# Patient Record
Sex: Female | Born: 1975
Health system: Southern US, Community
[De-identification: ages and names within clinical notes are randomized; demographics above are authoritative.]

## PROBLEM LIST (undated history)

## (undated) DIAGNOSIS — I509 Heart failure, unspecified: Secondary | ICD-10-CM

## (undated) DIAGNOSIS — R569 Unspecified convulsions: Secondary | ICD-10-CM

## (undated) DIAGNOSIS — F419 Anxiety disorder, unspecified: Secondary | ICD-10-CM

## (undated) DIAGNOSIS — C801 Malignant (primary) neoplasm, unspecified: Secondary | ICD-10-CM

## (undated) HISTORY — PX: OTHER SURGICAL HISTORY: SHX169

## (undated) HISTORY — PX: TUBAL LIGATION: SHX77

---

## 2004-05-16 ENCOUNTER — Emergency Department (HOSPITAL_COMMUNITY): Admission: EM | Admit: 2004-05-16 | Discharge: 2004-05-16 | Payer: Self-pay | Admitting: Emergency Medicine

## 2004-06-14 ENCOUNTER — Emergency Department (HOSPITAL_COMMUNITY): Admission: EM | Admit: 2004-06-14 | Discharge: 2004-06-14 | Payer: Self-pay | Admitting: Emergency Medicine

## 2004-07-12 ENCOUNTER — Emergency Department (HOSPITAL_COMMUNITY): Admission: EM | Admit: 2004-07-12 | Discharge: 2004-07-12 | Payer: Self-pay | Admitting: Emergency Medicine

## 2005-02-03 ENCOUNTER — Emergency Department (HOSPITAL_COMMUNITY): Admission: EM | Admit: 2005-02-03 | Discharge: 2005-02-03 | Payer: Self-pay | Admitting: Emergency Medicine

## 2005-07-03 ENCOUNTER — Other Ambulatory Visit: Payer: Self-pay

## 2005-07-03 ENCOUNTER — Emergency Department: Payer: Self-pay | Admitting: Emergency Medicine

## 2013-10-21 ENCOUNTER — Emergency Department: Payer: Self-pay | Admitting: Emergency Medicine

## 2013-11-04 ENCOUNTER — Emergency Department: Payer: Self-pay | Admitting: Emergency Medicine

## 2013-12-31 ENCOUNTER — Emergency Department: Payer: Self-pay | Admitting: Emergency Medicine

## 2013-12-31 LAB — COMPREHENSIVE METABOLIC PANEL
ALT: 20 U/L
AST: 27 U/L (ref 15–37)
Albumin: 3.5 g/dL (ref 3.4–5.0)
Alkaline Phosphatase: 91 U/L
Anion Gap: 7 (ref 7–16)
BUN: 14 mg/dL (ref 7–18)
Bilirubin,Total: 0.3 mg/dL (ref 0.2–1.0)
Calcium, Total: 8.5 mg/dL (ref 8.5–10.1)
Chloride: 107 mmol/L (ref 98–107)
Co2: 27 mmol/L (ref 21–32)
Creatinine: 0.76 mg/dL (ref 0.60–1.30)
Glucose: 106 mg/dL — ABNORMAL HIGH (ref 65–99)
Osmolality: 282 (ref 275–301)
Potassium: 3.8 mmol/L (ref 3.5–5.1)
Sodium: 141 mmol/L (ref 136–145)
TOTAL PROTEIN: 7.7 g/dL (ref 6.4–8.2)

## 2013-12-31 LAB — CBC
HCT: 36.8 % (ref 35.0–47.0)
HGB: 11.4 g/dL — ABNORMAL LOW (ref 12.0–16.0)
MCH: 24.8 pg — ABNORMAL LOW (ref 26.0–34.0)
MCHC: 30.9 g/dL — ABNORMAL LOW (ref 32.0–36.0)
MCV: 80 fL (ref 80–100)
Platelet: 149 10*3/uL — ABNORMAL LOW (ref 150–440)
RBC: 4.58 10*6/uL (ref 3.80–5.20)
RDW: 17.8 % — ABNORMAL HIGH (ref 11.5–14.5)
WBC: 12.6 10*3/uL — AB (ref 3.6–11.0)

## 2013-12-31 LAB — ETHANOL

## 2013-12-31 LAB — TROPONIN I

## 2014-01-28 ENCOUNTER — Emergency Department: Payer: Self-pay | Admitting: Student

## 2014-01-28 LAB — COMPREHENSIVE METABOLIC PANEL
ALBUMIN: 3.3 g/dL — AB (ref 3.4–5.0)
ALT: 22 U/L
Alkaline Phosphatase: 85 U/L
Anion Gap: 7 (ref 7–16)
BUN: 12 mg/dL (ref 7–18)
Bilirubin,Total: 0.1 mg/dL — ABNORMAL LOW (ref 0.2–1.0)
CHLORIDE: 110 mmol/L — AB (ref 98–107)
CO2: 25 mmol/L (ref 21–32)
CREATININE: 0.66 mg/dL (ref 0.60–1.30)
Calcium, Total: 8.4 mg/dL — ABNORMAL LOW (ref 8.5–10.1)
EGFR (African American): >60
EGFR (Non-African Amer.): >60
Glucose: 121 mg/dL — ABNORMAL HIGH (ref 65–99)
OSMOLALITY: 284 (ref 275–301)
POTASSIUM: 3.2 mmol/L — AB (ref 3.5–5.1)
SGOT(AST): 20 U/L (ref 15–37)
Sodium: 142 mmol/L (ref 136–145)
TOTAL PROTEIN: 7.2 g/dL (ref 6.4–8.2)

## 2014-01-28 LAB — URINALYSIS, COMPLETE
Bilirubin,UR: NEGATIVE
Blood: NEGATIVE
Glucose,UR: NEGATIVE mg/dL (ref 0–75)
Ketone: NEGATIVE
Nitrite: NEGATIVE
PH: 5 (ref 4.5–8.0)
Protein: NEGATIVE
RBC,UR: 2 /HPF (ref 0–5)
SPECIFIC GRAVITY: 1.027 (ref 1.003–1.030)
Squamous Epithelial: 27
WBC UR: 4 /HPF (ref 0–5)

## 2014-01-28 LAB — CBC
HCT: 34.9 % — ABNORMAL LOW (ref 35.0–47.0)
HGB: 10.8 g/dL — AB (ref 12.0–16.0)
MCH: 25.4 pg — AB (ref 26.0–34.0)
MCHC: 31 g/dL — ABNORMAL LOW (ref 32.0–36.0)
MCV: 82 fL (ref 80–100)
Platelet: 136 10*3/uL — ABNORMAL LOW (ref 150–440)
RBC: 4.25 10*6/uL (ref 3.80–5.20)
RDW: 15.6 % — ABNORMAL HIGH (ref 11.5–14.5)
WBC: 13.1 10*3/uL — ABNORMAL HIGH (ref 3.6–11.0)

## 2014-01-28 LAB — PREGNANCY, URINE: PREGNANCY TEST, URINE: NEGATIVE m[IU]/mL

## 2014-02-21 ENCOUNTER — Emergency Department: Payer: Self-pay | Admitting: Emergency Medicine

## 2014-02-21 LAB — BASIC METABOLIC PANEL
ANION GAP: 7 (ref 7–16)
BUN: 11 mg/dL (ref 7–18)
CO2: 25 mmol/L (ref 21–32)
Calcium, Total: 9.1 mg/dL (ref 8.5–10.1)
Chloride: 108 mmol/L — ABNORMAL HIGH (ref 98–107)
Creatinine: 0.92 mg/dL (ref 0.60–1.30)
EGFR (Non-African Amer.): >60
GLUCOSE: 98 mg/dL (ref 65–99)
Osmolality: 279 (ref 275–301)
Potassium: 3.8 mmol/L (ref 3.5–5.1)
Sodium: 140 mmol/L (ref 136–145)

## 2014-02-21 LAB — CBC
HCT: 36.6 % (ref 35.0–47.0)
HGB: 11.4 g/dL — ABNORMAL LOW (ref 12.0–16.0)
MCH: 25.5 pg — ABNORMAL LOW (ref 26.0–34.0)
MCHC: 31.1 g/dL — AB (ref 32.0–36.0)
MCV: 82 fL (ref 80–100)
Platelet: 171 10*3/uL (ref 150–440)
RBC: 4.48 10*6/uL (ref 3.80–5.20)
RDW: 16.2 % — AB (ref 11.5–14.5)
WBC: 12.5 10*3/uL — ABNORMAL HIGH (ref 3.6–11.0)

## 2014-03-07 ENCOUNTER — Emergency Department: Payer: Self-pay | Admitting: Emergency Medicine

## 2014-03-08 LAB — URINALYSIS, COMPLETE
BILIRUBIN, UR: NEGATIVE
BLOOD: NEGATIVE
Bacteria: NONE SEEN
Glucose,UR: NEGATIVE mg/dL (ref 0–75)
Ketone: NEGATIVE
LEUKOCYTE ESTERASE: NEGATIVE
Nitrite: NEGATIVE
Ph: 6 (ref 4.5–8.0)
Protein: NEGATIVE
RBC,UR: 1 /HPF (ref 0–5)
Specific Gravity: 1.028 (ref 1.003–1.030)
Squamous Epithelial: 1
WBC UR: 1 /HPF (ref 0–5)

## 2014-03-08 LAB — BASIC METABOLIC PANEL
Anion Gap: 5 — ABNORMAL LOW (ref 7–16)
BUN: 16 mg/dL (ref 7–18)
CHLORIDE: 113 mmol/L — AB (ref 98–107)
CO2: 26 mmol/L (ref 21–32)
Calcium, Total: 8.5 mg/dL (ref 8.5–10.1)
Creatinine: 1.07 mg/dL (ref 0.60–1.30)
EGFR (African American): >60
GLUCOSE: 100 mg/dL — AB (ref 65–99)
Osmolality: 288 (ref 275–301)
Potassium: 3.5 mmol/L (ref 3.5–5.1)
SODIUM: 144 mmol/L (ref 136–145)

## 2014-03-08 LAB — CBC
HCT: 33.6 % — AB (ref 35.0–47.0)
HGB: 10.5 g/dL — ABNORMAL LOW (ref 12.0–16.0)
MCH: 25.7 pg — ABNORMAL LOW (ref 26.0–34.0)
MCHC: 31.4 g/dL — ABNORMAL LOW (ref 32.0–36.0)
MCV: 82 fL (ref 80–100)
Platelet: 155 10*3/uL (ref 150–440)
RBC: 4.1 10*6/uL (ref 3.80–5.20)
RDW: 16.1 % — ABNORMAL HIGH (ref 11.5–14.5)
WBC: 11.8 10*3/uL — AB (ref 3.6–11.0)

## 2014-03-08 LAB — PREGNANCY, URINE: PREGNANCY TEST, URINE: NEGATIVE m[IU]/mL

## 2014-12-05 ENCOUNTER — Emergency Department
Admission: EM | Admit: 2014-12-05 | Discharge: 2014-12-05 | Disposition: A | Discharge status: Home / Self Care | Payer: Medicaid Other | Attending: Emergency Medicine | Admitting: Emergency Medicine

## 2014-12-05 ENCOUNTER — Encounter: Payer: Self-pay | Admitting: Emergency Medicine

## 2014-12-05 ENCOUNTER — Emergency Department: Payer: Medicaid Other

## 2014-12-05 DIAGNOSIS — R569 Unspecified convulsions: Secondary | ICD-10-CM | POA: Diagnosis present

## 2014-12-05 DIAGNOSIS — Z880 Allergy status to penicillin: Secondary | ICD-10-CM | POA: Insufficient documentation

## 2014-12-05 DIAGNOSIS — G40909 Epilepsy, unspecified, not intractable, without status epilepticus: Secondary | ICD-10-CM | POA: Insufficient documentation

## 2014-12-05 HISTORY — DX: Unspecified convulsions: R56.9

## 2014-12-05 HISTORY — DX: Heart failure, unspecified: I50.9

## 2014-12-05 LAB — CBC WITH DIFFERENTIAL/PLATELET
BASOS ABS: 0.1 10*3/uL (ref 0–0.1)
Basophils Relative: 1 %
Eosinophils Absolute: 0.1 10*3/uL (ref 0–0.7)
Eosinophils Relative: 1 %
HEMATOCRIT: 34.2 % — AB (ref 35.0–47.0)
HEMOGLOBIN: 11 g/dL — AB (ref 12.0–16.0)
Lymphocytes Relative: 17 %
Lymphs Abs: 1.8 10*3/uL (ref 1.0–3.6)
MCH: 24.8 pg — ABNORMAL LOW (ref 26.0–34.0)
MCHC: 32 g/dL (ref 32.0–36.0)
MCV: 77.4 fL — ABNORMAL LOW (ref 80.0–100.0)
Monocytes Absolute: 0.8 10*3/uL (ref 0.2–0.9)
Monocytes Relative: 8 %
NEUTROS ABS: 7.9 10*3/uL — AB (ref 1.4–6.5)
NEUTROS PCT: 73 %
Platelets: 154 10*3/uL (ref 150–440)
RBC: 4.42 MIL/uL (ref 3.80–5.20)
RDW: 16.8 % — ABNORMAL HIGH (ref 11.5–14.5)
WBC: 10.7 10*3/uL (ref 3.6–11.0)

## 2014-12-05 LAB — COMPREHENSIVE METABOLIC PANEL
ALK PHOS: 83 U/L (ref 38–126)
ALT: 13 U/L — ABNORMAL LOW (ref 14–54)
ANION GAP: 7 (ref 5–15)
AST: 26 U/L (ref 15–41)
Albumin: 4.5 g/dL (ref 3.5–5.0)
BILIRUBIN TOTAL: 0.5 mg/dL (ref 0.3–1.2)
BUN: 10 mg/dL (ref 6–20)
CALCIUM: 9.3 mg/dL (ref 8.9–10.3)
CO2: 24 mmol/L (ref 22–32)
Chloride: 108 mmol/L (ref 101–111)
Creatinine, Ser: 0.69 mg/dL (ref 0.44–1.00)
GFR calc non Af Amer: >60 mL/min (ref >60)
Glucose, Bld: 104 mg/dL — ABNORMAL HIGH (ref 65–99)
Potassium: 3.7 mmol/L (ref 3.5–5.1)
SODIUM: 139 mmol/L (ref 135–145)
TOTAL PROTEIN: 8.1 g/dL (ref 6.5–8.1)

## 2014-12-05 MED ORDER — SODIUM CHLORIDE 0.9 % IV SOLN
1000.0000 mg | Freq: Once | INTRAVENOUS | Status: AC
  Administered 2014-12-05: 1000 mg via INTRAVENOUS
  Filled 2014-12-05 (×2): qty 10

## 2014-12-05 MED ORDER — LORAZEPAM 2 MG/ML IJ SOLN
INTRAMUSCULAR | Status: AC
  Filled 2014-12-05: qty 1

## 2014-12-05 MED ORDER — LORAZEPAM 2 MG/ML IJ SOLN
2.0000 mg | Freq: Once | INTRAMUSCULAR | Status: AC
  Administered 2014-12-05: 2 mg via INTRAVENOUS

## 2014-12-05 NOTE — ED Provider Notes (Signed)
[facility] Emergency Department Jill Levy Note  ____________________________________________  Time seen: 1:30 AM  I have reviewed the triage vital signs and the nursing notes.   HISTORY  Chief Complaint Seizures      HPI Jill Levy is a 39 y.o. female resents with 2 witnessed generalized tonic seizure today by her son lasted approximately 1 minute each separated by approximately 3-4 minutes. Patient has a history of seizure disorder for which she takes Keppra 500 mg twice a day. Patient denies missing any doses except her evening dose tonight. Patient denies any recent illness no nausea no vomiting or fever. Of note patient states she has a history of a brain tumor for which she is followed by [facility] with plans for upcoming surgery.    Past medical history "Brain tumor" Epilepsy There are no active problems to display for this patient.   Past surgical history   Allergies Penicillin Lovenox No family history on file.  Social History Social History  Substance Use Topics  . Smoking status: Not on file  . Smokeless tobacco: Not on file  . Alcohol Use: Not on file    Review of Systems  Constitutional: Negative for fever. Eyes: Negative for visual changes. ENT: Negative for sore throat. Cardiovascular: Negative for chest pain. Respiratory: Negative for shortness of breath. Gastrointestinal: Negative for abdominal pain, vomiting and diarrhea. Genitourinary: Negative for dysuria. Musculoskeletal: Negative for back pain. Skin: Negative for rash. Neurological: Negative for headaches, focal weakness or numbness. Positive for seizure   10-point ROS otherwise negative.  ____________________________________________   PHYSICAL EXAM:  VITAL SIGNS: ED Triage Vitals  Enc Vitals Group     BP 12/05/14 0128 134/97 mmHg     Pulse Rate 12/05/14 0128 99     Resp 12/05/14 0128 16     Temp 12/05/14 0128 97.9 F (36.6 C)     Temp Source 12/05/14  0128 Oral     SpO2 12/05/14 0128 99 %     Weight 12/05/14 0128 161 lb (73.029 kg)     Height 12/05/14 0128 5\' 4"  (1.626 m)     Head Cir --      Peak Flow --      Pain Score 12/05/14 0133 8     Pain Loc --      Pain Edu? --      Excl. in [place]? --      Constitutional: Alert and oriented. Well appearing and in no distress. Eyes: Conjunctivae are normal. PERRL. Normal extraocular movements. ENT   Head: Normocephalic and atraumatic.   Nose: No congestion/rhinnorhea.   Mouth/Throat: Mucous membranes are moist.   Neck: No stridor. Cardiovascular: Normal rate, regular rhythm. Normal and symmetric distal pulses are present in all extremities. No murmurs, rubs, or gallops. Respiratory: Normal respiratory effort without tachypnea nor retractions. Breath sounds are clear and equal bilaterally. No wheezes/rales/rhonchi. Gastrointestinal: Soft and nontender. No distention. There is no CVA tenderness. Genitourinary: deferred Musculoskeletal: Nontender with normal range of motion in all extremities. No joint effusions.  No lower extremity tenderness nor edema. Neurologic:  Normal speech and language. No gross focal neurologic deficits are appreciated. Speech is normal.  Skin:  Skin is warm, dry and intact. No rash noted. Psychiatric: Mood and affect are normal. Speech and behavior are normal. Patient exhibits appropriate insight and judgment.  ____________________________________________    LABS (pertinent positives/negatives)  Labs Reviewed  COMPREHENSIVE METABOLIC PANEL - Abnormal; Notable for the following:    Glucose, Bld 104 (*)  ALT 13 (*)    All other components within normal limits  CBC WITH DIFFERENTIAL/PLATELET - Abnormal; Notable for the following:    Hemoglobin 11.0 (*)    HCT 34.2 (*)    MCV 77.4 (*)    MCH 24.8 (*)    RDW 16.8 (*)    Neutro Abs 7.9 (*)    All other components within normal limits  URINALYSIS COMPLETEWITH MICROSCOPIC ([facility] ONLY)      ____________________________________________   EKG  ED ECG REPORT I, BROWN, [place] N, the attending physician, personally viewed and interpreted this ECG.   Date: 12/05/2014  EKG Time: 1:31 AM  Rate: 114  Rhythm: Sinus tachycardia  Axis: None  Intervals: Normal  ST&T Change: None   ____________________________________________    RADIOLOGY  CT Head Wo Contrast (Final result) Result time: 12/05/14 02:40:58   Final result by Rad Results In Interface (12/05/14 02:40:58)   Narrative:   CLINICAL DATA: Seizure activity ; 2 seizures this evening for 1 minutes. History of seizures, brain tumor and CHF.  EXAM: CT HEAD WITHOUT CONTRAST  TECHNIQUE: Contiguous axial images were obtained from the base of the skull through the vertex without intravenous contrast.  COMPARISON: CT head March 08, 2014  FINDINGS: The ventricles and sulci are normal. No intraparenchymal hemorrhage, mass effect nor midline shift. No acute large vascular territory infarcts.  No abnormal extra-axial fluid collections. Basal cisterns are patent.  No skull fracture. The included ocular globes and orbital contents are non-suspicious. Less sphenoid mucosal retention cyst without paranasal sinus air-fluid levels. The mastoid air cells are well aerated.  IMPRESSION: No acute intracranial process. No CT findings of brain tumor or, prior brain surgery though MRI with contrast would be more sensitive.   Electronically Signed By: Elon Alas M.D. On: 12/05/2014 02:40        ECG Results        INITIAL IMPRESSION / ASSESSMENT AND PLAN / ED COURSE  Pertinent labs & imaging results that were available during my care of the patient were reviewed by me and considered in my medical decision making (see chart for details).  Ativan 2 mg IV given as well as Keppra 1000mg  given   ____________________________________________   FINAL CLINICAL IMPRESSION(S) / ED  DIAGNOSES  Final diagnoses:  Seizure      Gregor Hams, MD 12/05/14 [phone]

## 2014-12-05 NOTE — ED Notes (Signed)
Pt noted sleeping soundly.  

## 2014-12-05 NOTE — ED Notes (Signed)
Pt presents to ED via [facility] from personal home with c/o of seizure activity. EMS states experienced x2 seizure this evening, each one lasting approximately 1 min each., EMS states pt has a hx of seizures in the past, daily medication use of Keppra and Citalopram. EMS states pt has a hx of brain tumor and CHF. EMS states pt was post-ictal upon arrival to scene. Pt arrived to ER alert and oriented x4, able to follow verbal and physical commands.

## 2014-12-05 NOTE — Discharge Instructions (Signed)

## 2015-03-09 ENCOUNTER — Emergency Department
Admission: EM | Admit: 2015-03-09 | Discharge: 2015-03-09 | Disposition: A | Discharge status: Home / Self Care | Payer: Medicaid Other | Attending: Emergency Medicine | Admitting: Emergency Medicine

## 2015-03-09 ENCOUNTER — Encounter: Payer: Self-pay | Admitting: Emergency Medicine

## 2015-03-09 ENCOUNTER — Emergency Department: Payer: Medicaid Other

## 2015-03-09 DIAGNOSIS — Z79899 Other long term (current) drug therapy: Secondary | ICD-10-CM | POA: Diagnosis not present

## 2015-03-09 DIAGNOSIS — Z880 Allergy status to penicillin: Secondary | ICD-10-CM | POA: Insufficient documentation

## 2015-03-09 DIAGNOSIS — R569 Unspecified convulsions: Secondary | ICD-10-CM | POA: Diagnosis present

## 2015-03-09 HISTORY — DX: Anxiety disorder, unspecified: F41.9

## 2015-03-09 HISTORY — DX: Malignant (primary) neoplasm, unspecified: C80.1

## 2015-03-09 LAB — COMPREHENSIVE METABOLIC PANEL
ALBUMIN: 3.9 g/dL (ref 3.5–5.0)
ALT: 18 U/L (ref 14–54)
AST: 18 U/L (ref 15–41)
Alkaline Phosphatase: 88 U/L (ref 38–126)
Anion gap: 6 (ref 5–15)
BILIRUBIN TOTAL: 0.2 mg/dL — AB (ref 0.3–1.2)
BUN: 13 mg/dL (ref 6–20)
CO2: 25 mmol/L (ref 22–32)
Calcium: 8.9 mg/dL (ref 8.9–10.3)
Chloride: 109 mmol/L (ref 101–111)
Creatinine, Ser: 0.61 mg/dL (ref 0.44–1.00)
GFR calc Af Amer: >60 mL/min (ref >60)
GFR calc non Af Amer: >60 mL/min (ref >60)
GLUCOSE: 112 mg/dL — AB (ref 65–99)
POTASSIUM: 3.5 mmol/L (ref 3.5–5.1)
SODIUM: 140 mmol/L (ref 135–145)
TOTAL PROTEIN: 7.3 g/dL (ref 6.5–8.1)

## 2015-03-09 LAB — TROPONIN I: Troponin I: <0.03 ng/mL (ref <0.031)

## 2015-03-09 LAB — URINALYSIS COMPLETE WITH MICROSCOPIC (ARMC ONLY)
BACTERIA UA: NONE SEEN
Bilirubin Urine: NEGATIVE
GLUCOSE, UA: NEGATIVE mg/dL
Ketones, ur: NEGATIVE mg/dL
LEUKOCYTES UA: NEGATIVE
Nitrite: NEGATIVE
PH: 6 (ref 5.0–8.0)
PROTEIN: 30 mg/dL — AB
SPECIFIC GRAVITY, URINE: 1.016 (ref 1.005–1.030)

## 2015-03-09 LAB — CBC
HCT: 28.4 % — ABNORMAL LOW (ref 35.0–47.0)
Hemoglobin: 8.9 g/dL — ABNORMAL LOW (ref 12.0–16.0)
MCH: 22.9 pg — ABNORMAL LOW (ref 26.0–34.0)
MCHC: 31.5 g/dL — AB (ref 32.0–36.0)
MCV: 72.8 fL — ABNORMAL LOW (ref 80.0–100.0)
PLATELETS: 158 10*3/uL (ref 150–440)
RBC: 3.9 MIL/uL (ref 3.80–5.20)
RDW: 17.6 % — AB (ref 11.5–14.5)
WBC: 10.9 10*3/uL (ref 3.6–11.0)

## 2015-03-09 MED ORDER — LEVETIRACETAM 500 MG PO TABS
1000.0000 mg | ORAL_TABLET | Freq: Once | ORAL | Status: AC
  Administered 2015-03-09: 1000 mg via ORAL
  Filled 2015-03-09: qty 2

## 2015-03-09 NOTE — ED Notes (Signed)
Pt reports that she had a seizure this evening and son called EMS. Per [facility] no witnessed seizures since arrival.

## 2015-03-09 NOTE — ED Provider Notes (Signed)
[facility] Emergency Department Provider Note  ____________________________________________  Time seen: Approximately 0039 AM  I have reviewed the triage vital signs and the nursing notes.   HISTORY  Chief Complaint Seizures    HPI Jill Levy is a 40 y.o. female comes into the hospital today with a seizure. The patient reports that her son called EMS and her family told her to come in to get evaluated. The patient reports that she is supposed to be having surgery for an opening behind her right eye with a small tumor. She reports that she was was to have this surgery last May but her son committed suicide. The patient reports that she's gone about a month without having a seizure which was before she was regulated on her medication. The patient takes Keppra and reports that she takes it daily. The patient reports that she has not been sleeping well as this is the first holiday season she's had to celebrate without her son and she has been feeling very stressed out. The patient also has a history of seizures whenever she gets stressed out. The patient had another family member who also died recently which has been take to her stress. The patient is unsure how long her seizure lasted and reports that she feels achy all over but otherwise is in no acute distress.Patient rates her pain an 8 out of 10 in intensity.   Past Medical History  Diagnosis Date  . CHF (congestive heart failure) ([place])   . Seizure ([place])   . Cancer ([place]) cervical  . Anxiety     There are no active problems to display for this patient.   Past Surgical History  Procedure Laterality Date  . Tubal ligation      Current Outpatient Rx  Name  Route  Sig  Dispense  Refill  . ALPRAZolam (XANAX) 0.5 MG tablet   Oral   Take 0.5 mg by mouth 3 (three) times daily as needed for anxiety.         . carbamazepine (TEGRETOL) 200 MG tablet   Oral   Take 200 mg by mouth 2 (two) times daily.         Marland Kitchen CITALOPRAM HYDROBROMIDE PO   Oral   Take by mouth.         . LevETIRAcetam (KEPPRA PO)   Oral   Take 750 mg by mouth 2 (two) times daily.            Allergies Amoxapine and related; Lovenox; and Penicillins  No family history on file.  Social History Social History  Substance Use Topics  . Smoking status: Never Smoker   . Smokeless tobacco: None  . Alcohol Use: No    Review of Systems Constitutional: No fever/chills Eyes: No visual changes. ENT: No sore throat. Cardiovascular: Denies chest pain. Respiratory: Denies shortness of breath. Gastrointestinal: No abdominal pain.  No nausea, no vomiting.  No diarrhea.  No constipation. Genitourinary: Negative for dysuria. Musculoskeletal: Negative for back pain. Skin: Negative for rash. Neurological: Negative for headaches, focal weakness or numbness.  10-point ROS otherwise negative.  ____________________________________________   PHYSICAL EXAM:  VITAL SIGNS: ED Triage Vitals  Enc Vitals Group     BP 03/09/15 0029 148/95 mmHg     Pulse Rate 03/09/15 0029 95     Resp 03/09/15 0029 16     Temp 03/09/15 0029 98.9 F (37.2 C)     Temp Source 03/09/15 0029 Oral     SpO2 03/09/15  0029 100 %     Weight 03/09/15 0029 161 lb (73.029 kg)     Height 03/09/15 0029 5\' 4"  (1.626 m)     Head Cir --      Peak Flow --      Pain Score 03/09/15 0032 8     Pain Loc --      Pain Edu? --      Excl. in [place]? --     Constitutional: Alert and oriented. Well appearing and in mild distress. Eyes: Conjunctivae are normal. PERRL. EOMI. Head: Atraumatic. Nose: No congestion/rhinnorhea. Mouth/Throat: Mucous membranes are moist.  Oropharynx non-erythematous. Cardiovascular: Normal rate, regular rhythm. Grossly normal heart sounds.  Good peripheral circulation. Respiratory: Normal respiratory effort.  No retractions. Lungs CTAB. Gastrointestinal: Soft and nontender. No distention. Positive bowel sounds Musculoskeletal: No  lower extremity tenderness nor edema.   Neurologic:  Normal speech and language. No gross focal neurologic deficits are appreciated. Radial nerves II through XII are grossly intact no focal motor or neuro deficits Skin:  Skin is warm, dry and intact.  Psychiatric: Mood and affect are normal.   ____________________________________________   LABS (all labs ordered are listed, but only abnormal results are displayed)  Labs Reviewed  CBC - Abnormal; Notable for the following:    Hemoglobin 8.9 (*)    HCT 28.4 (*)    MCV 72.8 (*)    MCH 22.9 (*)    MCHC 31.5 (*)    RDW 17.6 (*)    All other components within normal limits  COMPREHENSIVE METABOLIC PANEL - Abnormal; Notable for the following:    Glucose, Bld 112 (*)    Total Bilirubin 0.2 (*)    All other components within normal limits  URINALYSIS COMPLETEWITH MICROSCOPIC ([facility] ONLY) - Abnormal; Notable for the following:    Color, Urine YELLOW (*)    APPearance CLEAR (*)    Hgb urine dipstick 3+ (*)    Protein, ur 30 (*)    Squamous Epithelial / LPF 0-5 (*)    All other components within normal limits  TROPONIN I   ____________________________________________  EKG  ED ECG REPORT I, Loney Hering, the attending physician, personally viewed and interpreted this ECG.   Date: 03/09/2015  EKG Time: 0041  Rate: 88  Rhythm: normal sinus rhythm  Axis: normal  Intervals:none  ST&T Change: non  ____________________________________________  RADIOLOGY  CT head: No acute intracranial pathology seen on CT, mucous retention cyst or polyp at the left side of the sphenoid sinus ____________________________________________   PROCEDURES  Procedure(s) performed: None  Critical Care performed: No  ____________________________________________   INITIAL IMPRESSION / ASSESSMENT AND PLAN / ED COURSE  Pertinent labs & imaging results that were available during my care of the patient were reviewed by me and considered in my  medical decision making (see chart for details).  This is a 40 year old female who comes into the hospital today with a seizure. The patient does have a seizure disorder. She has been under a lot of stress and not sleeping well. She reports that she has been taking her medications. The patient did have some blood work as well as imaging done which were unremarkable. I did give the patient a dose of Keppra and I did monitor her here in the emergency department where she did not have any more seizures. I will discharge the patient to home and have her follow-up with her neurologist. The patient does report that she has some history of anemia and  she is currently on her period. He reports that she takes iron as well for her anemia and hence been lower than 8.9 hemoglobin in the past. The patient will be discharged to home ____________________________________________   FINAL CLINICAL IMPRESSION(S) / ED DIAGNOSES  Final diagnoses:  Seizure ([place])      Loney Hering, MD 03/09/15 [phone]

## 2015-03-09 NOTE — Discharge Instructions (Signed)

## 2016-01-25 ENCOUNTER — Encounter: Payer: Self-pay | Admitting: Emergency Medicine

## 2016-01-25 ENCOUNTER — Emergency Department
Admission: EM | Admit: 2016-01-25 | Discharge: 2016-01-25 | Disposition: A | Discharge status: Home / Self Care | Payer: Medicaid Other | Attending: Emergency Medicine | Admitting: Emergency Medicine

## 2016-01-25 DIAGNOSIS — Z79899 Other long term (current) drug therapy: Secondary | ICD-10-CM | POA: Diagnosis not present

## 2016-01-25 DIAGNOSIS — Z859 Personal history of malignant neoplasm, unspecified: Secondary | ICD-10-CM | POA: Diagnosis not present

## 2016-01-25 DIAGNOSIS — F329 Major depressive disorder, single episode, unspecified: Secondary | ICD-10-CM | POA: Insufficient documentation

## 2016-01-25 DIAGNOSIS — F32A Depression, unspecified: Secondary | ICD-10-CM

## 2016-01-25 DIAGNOSIS — I509 Heart failure, unspecified: Secondary | ICD-10-CM | POA: Diagnosis not present

## 2016-01-25 DIAGNOSIS — F410 Panic disorder [episodic paroxysmal anxiety] without agoraphobia: Secondary | ICD-10-CM

## 2016-01-25 NOTE — ED Notes (Signed)
Pt complains of generalized headache. Pt states she lost her son to suicide a year ago and she has been thinking about that stress frequently recently. Pt also reports she has not been taking her 1500mg  of keppra for 3 weeks due to "i moved". Pt also reports she does not take her propranolol or xanax because "i don't like the way it makes me feel."

## 2016-01-25 NOTE — ED Notes (Signed)
Pt up to restroom to void, reports feeling improved.

## 2016-01-25 NOTE — ED Provider Notes (Signed)
[facility] Emergency Department Provider Note  ____________________________________________   First MD Initiated Contact with Patient 01/25/16 0502     (approximate)  I have reviewed the triage vital signs and the nursing notes.   HISTORY  Chief Complaint Panic Attack    HPI Jill Levy is a 40 y.o. female with a history that includes seizure versus pseudoseizure and anxietywho presents for evaluation after which she describes is a panic attack at work.  She states that she does not take any of her medications as prescribed including her Keppra (she does not tend to get it refilled when she gets paid) and the propranolol and Xanax for her anxiety because she does not like the way it makes her feel.  She reports that she has been gradually increasingly depressed over the last few weeks because her son committed suicide about a year ago and she has been thinking about him a lot lately.  She got a call at work from a relative who told her that his own son had just tried to commit suicide and that brought back a lot of bad memories.  She started breathing rapidly, having tightness in the chest, and felt like she was going to pass out like she does when she has a panic attack.  She told EMS and triage that her anxiety was bad enough "that she thought she might have a seizure".  When I saw her she had been sleeping comfortably in the exam room and denies any symptoms.  She states that she feels much better and is ready to go home.  She adamantly denies any suicidal ideation or homicidal ideation and states that she is just a little bit depressed.  She does not have a therapist with whom she speaks regularly.   Past Medical History:  Diagnosis Date  . Anxiety   . Cancer ([facility]) cervical  . CHF (congestive heart failure) ([place])   . Seizure ([place])     There are no active problems to display for this patient.   Past Surgical History:  Procedure Laterality Date  .  TUBAL LIGATION      Prior to Admission medications   Medication Sig Start Date End Date Taking? Authorizing Provider  ALPRAZolam Duanne Moron) 0.5 MG tablet Take 0.5 mg by mouth 3 (three) times daily as needed for anxiety.    Historical Provider, MD  carbamazepine (TEGRETOL) 200 MG tablet Take 200 mg by mouth 2 (two) times daily.    Historical Provider, MD  CITALOPRAM HYDROBROMIDE PO Take by mouth.    Historical Provider, MD  LevETIRAcetam (KEPPRA PO) Take 750 mg by mouth 2 (two) times daily.     Historical Provider, MD    Allergies Amoxapine and related; Coumadin [warfarin sodium]; Lovenox [enoxaparin]; and Penicillins  History reviewed. No pertinent family history.  Social History Social History  Substance Use Topics  . Smoking status: Never Smoker  . Smokeless tobacco: Never Used  . Alcohol use No    Review of Systems Constitutional: No fever/chills Eyes: No visual changes. ENT: No sore throat. Cardiovascular: chest tightness during panic attack, now resolved Respiratory: Denies shortness of breath. Gastrointestinal: No abdominal pain.  No nausea, no vomiting.  No diarrhea.  No constipation. Genitourinary: Negative for dysuria. Musculoskeletal: Negative for back pain. Skin: Negative for rash. Neurological: Negative for headaches, focal weakness or numbness.  10-point ROS otherwise negative.  ____________________________________________   PHYSICAL EXAM:  VITAL SIGNS: ED Triage Vitals  Enc Vitals Group  BP 01/25/16 0342 122/85     Pulse Rate 01/25/16 0342 89     Resp 01/25/16 0340 20     Temp 01/25/16 0342 98 F (36.7 C)     Temp Source 01/25/16 0342 Oral     SpO2 01/25/16 0342 100 %     Weight 01/25/16 0340 178 lb (80.7 kg)     Height 01/25/16 0340 5\' 4"  (1.626 m)     Head Circumference --      Peak Flow --      Pain Score 01/25/16 0340 4     Pain Loc --      Pain Edu? --      Excl. in [place]? --     Constitutional: Alert and oriented. Well appearing and in  no acute distress. Eyes: Conjunctivae are normal. PERRL. EOMI. Head: Atraumatic. Nose: No congestion/rhinnorhea. Mouth/Throat: Mucous membranes are moist.  Oropharynx non-erythematous. Neck: No stridor.  No meningeal signs.   Cardiovascular: Normal rate, regular rhythm. Good peripheral circulation. Grossly normal heart sounds. Respiratory: Normal respiratory effort.  No retractions. Lungs CTAB. Gastrointestinal: Soft and nontender. No distention.  Musculoskeletal: No lower extremity tenderness nor edema. No gross deformities of extremities. Neurologic:  Normal speech and language. No gross focal neurologic deficits are appreciated.  Skin:  Skin is warm, dry and intact. No rash noted. Psychiatric: Mood and affect are normal. Speech and behavior are normal. Denies SI/HI  ____________________________________________   LABS (all labs ordered are listed, but only abnormal results are displayed)  Labs Reviewed - No data to display ____________________________________________  EKG  ED ECG REPORT I, Ollie Delano, the attending physician, personally viewed and interpreted this ECG.  Date: 01/25/2016 EKG Time: 3:44 AM Rate: 88 Rhythm: normal sinus rhythm QRS Axis: normal Intervals: normal ST/T Wave abnormalities: normal Conduction Disturbances: none Narrative Interpretation: unremarkable  ____________________________________________  RADIOLOGY   No results found.  ____________________________________________   PROCEDURES  Procedure(s) performed:   Procedures   Critical Care performed: No ____________________________________________   INITIAL IMPRESSION / ASSESSMENT AND PLAN / ED COURSE  Pertinent labs & imaging results that were available during my care of the patient were reviewed by me and considered in my medical decision making (see chart for details).  The patient had a brief episode of panic or anxiety attack after receiving bad news at work which  exacerbated her own recent fillings, but she has no acute or emergent medical condition.  I discussed with her how she should really be taking her medications as prescribed.  I gave her the name and number for [facility] with whom she can follow-up for mental health services.  I am reassured that she is not a danger to herself or others and she meets no criteria for involuntary commitment or inpatient psychiatric care.  She wants to go home and has the capacity to make her decisions and I think it is appropriate.  I gave my usual and customary return precautions.     ____________________________________________  FINAL CLINICAL IMPRESSION(S) / ED DIAGNOSES  Final diagnoses:  Depression, unspecified depression type  Panic attack     MEDICATIONS GIVEN DURING THIS VISIT:  Medications - No data to display   NEW OUTPATIENT MEDICATIONS STARTED DURING THIS VISIT:  Discharge Medication List as of 01/25/2016  5:39 AM      Discharge Medication List as of 01/25/2016  5:39 AM      Discharge Medication List as of 01/25/2016  5:39 AM       Note:  This  document was prepared using Systems analyst and may include unintentional dictation errors.    Hinda Kehr, MD 01/25/16 [phone]

## 2016-01-25 NOTE — ED Triage Notes (Signed)
Pt states was a work at rest when she felt "my anxiety attack". Pt states she felt like the anxiety was going to cause her to have a seizure. Pt alert and oriented x4. Pt complains of headache.

## 2016-01-25 NOTE — Discharge Instructions (Signed)
You have been seen in the Emergency Department (ED) today for chest pain.  We feel it is likely that panic attacks may be causing your symptoms, along with some depression.  Please follow up with the recommended doctor as instructed above in these documents regarding today?s emergent visit and your recent symptoms to discuss further management.  Continue to take your regular medications.   Return to the Emergency Department (ED) if you experience any further chest pain/pressure/tightness, difficulty breathing, or sudden sweating, or other symptoms that concern you.

## 2016-07-25 ENCOUNTER — Emergency Department
Admission: EM | Admit: 2016-07-25 | Discharge: 2016-07-26 | Disposition: A | Discharge status: Home / Self Care | Payer: No Typology Code available for payment source | Attending: Emergency Medicine | Admitting: Emergency Medicine

## 2016-07-25 DIAGNOSIS — Y9389 Activity, other specified: Secondary | ICD-10-CM | POA: Diagnosis not present

## 2016-07-25 DIAGNOSIS — S199XXA Unspecified injury of neck, initial encounter: Secondary | ICD-10-CM | POA: Diagnosis present

## 2016-07-25 DIAGNOSIS — S161XXA Strain of muscle, fascia and tendon at neck level, initial encounter: Secondary | ICD-10-CM | POA: Diagnosis not present

## 2016-07-25 DIAGNOSIS — I509 Heart failure, unspecified: Secondary | ICD-10-CM | POA: Insufficient documentation

## 2016-07-25 DIAGNOSIS — S39012A Strain of muscle, fascia and tendon of lower back, initial encounter: Secondary | ICD-10-CM | POA: Diagnosis not present

## 2016-07-25 DIAGNOSIS — Y999 Unspecified external cause status: Secondary | ICD-10-CM | POA: Diagnosis not present

## 2016-07-25 DIAGNOSIS — M25552 Pain in left hip: Secondary | ICD-10-CM | POA: Diagnosis not present

## 2016-07-25 DIAGNOSIS — Y92410 Unspecified street and highway as the place of occurrence of the external cause: Secondary | ICD-10-CM | POA: Insufficient documentation

## 2016-07-25 MED ORDER — OXYCODONE-ACETAMINOPHEN 5-325 MG PO TABS
1.0000 | ORAL_TABLET | Freq: Once | ORAL | Status: AC
  Administered 2016-07-26: 1 via ORAL
  Filled 2016-07-25: qty 1
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
## 2016-07-26 ENCOUNTER — Emergency Department: Payer: No Typology Code available for payment source

## 2016-07-26 MED ORDER — CYCLOBENZAPRINE HCL 5 MG PO TABS: 5.0000 mg | ORAL_TABLET | Freq: Three times a day (TID) | ORAL | 0 refills | Status: DC | PRN

## 2016-07-26 MED ORDER — TRAMADOL HCL 50 MG PO TABS: 50.0000 mg | ORAL_TABLET | Freq: Four times a day (QID) | ORAL | 0 refills | Status: AC | PRN

## 2016-07-26 MED ORDER — CYCLOBENZAPRINE HCL 10 MG PO TABS
5.0000 mg | ORAL_TABLET | Freq: Once | ORAL | Status: AC
  Administered 2016-07-26: 5 mg via ORAL
  Filled 2016-07-26: qty 1
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
## 2016-07-26 NOTE — ED Notes (Signed)
Pt discharged to home.  Family member driving.  Discharge instructions reviewed.  Verbalized understanding.  No questions or concerns at this time.  Teach back verified.  Pt in NAD.  No items left in ED.   

## 2016-08-22 ENCOUNTER — Ambulatory Visit: Payer: Self-pay | Admitting: Physical Therapy

## 2016-08-23 ENCOUNTER — Ambulatory Visit: Payer: Self-pay | Attending: Orthopaedic Surgery

## 2016-08-23 DIAGNOSIS — M6281 Muscle weakness (generalized): Secondary | ICD-10-CM | POA: Insufficient documentation

## 2016-08-23 DIAGNOSIS — M542 Cervicalgia: Secondary | ICD-10-CM | POA: Insufficient documentation

## 2016-08-23 DIAGNOSIS — M5442 Lumbago with sciatica, left side: Secondary | ICD-10-CM | POA: Insufficient documentation
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
## 2016-08-24 ENCOUNTER — Ambulatory Visit: Payer: Self-pay | Admitting: Physical Therapy

## 2016-08-25 ENCOUNTER — Ambulatory Visit: Payer: Self-pay

## 2016-08-25 DIAGNOSIS — M542 Cervicalgia: Secondary | ICD-10-CM

## 2016-08-25 DIAGNOSIS — M5442 Lumbago with sciatica, left side: Secondary | ICD-10-CM
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
## 2016-08-29 ENCOUNTER — Ambulatory Visit: Payer: Self-pay | Admitting: Physical Therapy

## 2016-08-29 ENCOUNTER — Encounter: Payer: Self-pay | Admitting: Physical Therapy

## 2016-08-29 DIAGNOSIS — M542 Cervicalgia: Secondary | ICD-10-CM

## 2016-08-29 DIAGNOSIS — M6281 Muscle weakness (generalized): Secondary | ICD-10-CM

## 2016-08-29 DIAGNOSIS — M5442 Lumbago with sciatica, left side: Secondary | ICD-10-CM
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
## 2016-08-30 ENCOUNTER — Encounter: Payer: Self-pay | Admitting: Physical Therapy

## 2016-09-01 ENCOUNTER — Ambulatory Visit: Payer: Self-pay | Admitting: Physical Therapy

## 2016-09-01 DIAGNOSIS — M6281 Muscle weakness (generalized): Secondary | ICD-10-CM

## 2016-09-01 DIAGNOSIS — M542 Cervicalgia: Secondary | ICD-10-CM

## 2016-09-01 DIAGNOSIS — M5442 Lumbago with sciatica, left side: Secondary | ICD-10-CM

## 2016-09-01 NOTE — Patient Instructions (Signed)
L  Flexion to 97 Abduction to 91   R - WNL for both with no pain   Standing trunk flexion -- favors L side   Trunk extension   Trunk rotations - full ROM with no added pain
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
## 2016-09-05 ENCOUNTER — Ambulatory Visit: Payer: Self-pay | Attending: Orthopaedic Surgery | Admitting: Physical Therapy

## 2016-09-05 DIAGNOSIS — M542 Cervicalgia: Secondary | ICD-10-CM | POA: Insufficient documentation

## 2016-09-05 DIAGNOSIS — M6281 Muscle weakness (generalized): Secondary | ICD-10-CM | POA: Insufficient documentation

## 2016-09-05 DIAGNOSIS — M5442 Lumbago with sciatica, left side: Secondary | ICD-10-CM | POA: Insufficient documentation

## 2016-09-05 NOTE — Patient Instructions (Signed)
CPAs grade I-II through lower thoracic spine and lumbar spine   ER PROM - full ROM very minimal pain at end range   Passive flexion felt tight and restricted with mild pain per patient at end range, thus added in AAROM with PVC pipe x 15 repetitions   AROM to 135 degrees
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
## 2016-09-14 ENCOUNTER — Ambulatory Visit: Payer: Self-pay | Admitting: Physical Therapy

## 2016-09-15 ENCOUNTER — Ambulatory Visit: Payer: Self-pay | Admitting: Physical Therapy

## 2016-09-15 DIAGNOSIS — M6281 Muscle weakness (generalized): Secondary | ICD-10-CM

## 2016-09-15 DIAGNOSIS — M5442 Lumbago with sciatica, left side: Secondary | ICD-10-CM

## 2016-09-15 DIAGNOSIS — M542 Cervicalgia: Secondary | ICD-10-CM
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
## 2016-09-19 ENCOUNTER — Ambulatory Visit: Payer: Self-pay | Admitting: Physical Therapy

## 2016-09-21 ENCOUNTER — Ambulatory Visit: Payer: Self-pay | Admitting: Physical Therapy

## 2016-09-21 DIAGNOSIS — M6281 Muscle weakness (generalized): Secondary | ICD-10-CM

## 2016-09-21 DIAGNOSIS — M542 Cervicalgia: Secondary | ICD-10-CM

## 2016-09-21 DIAGNOSIS — M5442 Lumbago with sciatica, left side: Secondary | ICD-10-CM

## 2016-09-21 NOTE — Patient Instructions (Addendum)
HS -- 3/5 on L side  Hip flexion - 4-/5, hip IR/ER - 3+/5 on L side   AROM cervical (all directions WNL), and L shoulder OH motion - WNL   L Patellar - 0 L Achilles - 2+ R patellar 1+ R Achilles 2+  Sensation - decreased on Lateral L leg (mild decrease on medial side of thigh)   Clonus - negative on L side   Babinski - negative   SLR to 30 degrees on L side (no pain) through full ROM on R side  Inversion/eversion/ great toe extension - 3 to 3-/5 on L side relative to R side   Supine bridging -- felt in glutes but shifting to R side in bilateral, felt in lumbar on R side only -- unable to really bring her hips off table on L side.   Grip Strength R 60#  59# 55# L 10# 20# 22#
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
## 2016-09-26 ENCOUNTER — Ambulatory Visit: Payer: Self-pay | Admitting: Physical Therapy

## 2016-09-29 ENCOUNTER — Ambulatory Visit: Payer: Self-pay | Admitting: Physical Therapy

## 2016-10-03 ENCOUNTER — Ambulatory Visit: Payer: Self-pay | Admitting: Physical Therapy

## 2016-10-06 ENCOUNTER — Encounter: Payer: Self-pay | Admitting: Physical Therapy

## 2016-10-10 ENCOUNTER — Encounter: Payer: Self-pay | Admitting: Physical Therapy

## 2016-10-13 ENCOUNTER — Encounter: Payer: Self-pay | Admitting: Physical Therapy

## 2016-12-19 ENCOUNTER — Emergency Department
Admission: EM | Admit: 2016-12-19 | Discharge: 2016-12-19 | Disposition: A | Discharge status: Home / Self Care | Payer: No Typology Code available for payment source | Attending: Emergency Medicine | Admitting: Emergency Medicine

## 2016-12-19 ENCOUNTER — Emergency Department: Payer: No Typology Code available for payment source

## 2016-12-19 DIAGNOSIS — Z859 Personal history of malignant neoplasm, unspecified: Secondary | ICD-10-CM | POA: Insufficient documentation

## 2016-12-19 DIAGNOSIS — J0100 Acute maxillary sinusitis, unspecified: Secondary | ICD-10-CM

## 2016-12-19 DIAGNOSIS — I509 Heart failure, unspecified: Secondary | ICD-10-CM | POA: Diagnosis not present

## 2016-12-19 DIAGNOSIS — Z79899 Other long term (current) drug therapy: Secondary | ICD-10-CM | POA: Insufficient documentation

## 2016-12-19 DIAGNOSIS — M7918 Myalgia, other site: Secondary | ICD-10-CM

## 2016-12-19 DIAGNOSIS — M791 Myalgia, unspecified site: Secondary | ICD-10-CM | POA: Diagnosis not present

## 2016-12-19 DIAGNOSIS — R51 Headache: Secondary | ICD-10-CM | POA: Diagnosis present

## 2016-12-19 LAB — URINE DRUG SCREEN, QUALITATIVE (ARMC ONLY)
AMPHETAMINES, UR SCREEN: NOT DETECTED
BARBITURATES, UR SCREEN: NOT DETECTED
BENZODIAZEPINE, UR SCRN: NOT DETECTED
COCAINE METABOLITE, UR ~~LOC~~: NOT DETECTED
Cannabinoid 50 Ng, Ur ~~LOC~~: NOT DETECTED
MDMA (Ecstasy)Ur Screen: NOT DETECTED
METHADONE SCREEN, URINE: NOT DETECTED
Opiate, Ur Screen: NOT DETECTED
Phencyclidine (PCP) Ur S: NOT DETECTED
TRICYCLIC, UR SCREEN: NOT DETECTED

## 2016-12-19 LAB — POCT PREGNANCY, URINE: PREG TEST UR: NEGATIVE

## 2016-12-19 MED ORDER — IBUPROFEN 600 MG PO TABS
600.0000 mg | ORAL_TABLET | Freq: Once | ORAL | Status: AC
  Administered 2016-12-19: 600 mg via ORAL
  Filled 2016-12-19: qty 1

## 2016-12-19 MED ORDER — CYCLOBENZAPRINE HCL 10 MG PO TABS
10.0000 mg | ORAL_TABLET | Freq: Once | ORAL | Status: AC
  Administered 2016-12-19: 10 mg via ORAL
  Filled 2016-12-19: qty 1

## 2016-12-19 MED ORDER — TRAMADOL HCL 50 MG PO TABS: 50.0000 mg | ORAL_TABLET | Freq: Two times a day (BID) | ORAL | 0 refills | Status: DC | PRN

## 2016-12-19 MED ORDER — IBUPROFEN 600 MG PO TABS: 600.0000 mg | ORAL_TABLET | Freq: Three times a day (TID) | ORAL | 0 refills | Status: DC | PRN

## 2016-12-19 MED ORDER — CLINDAMYCIN HCL 150 MG PO CAPS: 150.0000 mg | ORAL_CAPSULE | Freq: Four times a day (QID) | ORAL | 0 refills | Status: DC

## 2016-12-19 MED ORDER — CYCLOBENZAPRINE HCL 10 MG PO TABS: 10.0000 mg | ORAL_TABLET | Freq: Three times a day (TID) | ORAL | 0 refills | Status: DC | PRN

## 2016-12-19 MED ORDER — TRAMADOL HCL 50 MG PO TABS
50.0000 mg | ORAL_TABLET | Freq: Once | ORAL | Status: AC
  Administered 2016-12-19: 50 mg via ORAL
  Filled 2016-12-19: qty 1

## 2016-12-19 MED ORDER — FEXOFENADINE-PSEUDOEPHED ER 60-120 MG PO TB12: 1.0000 | ORAL_TABLET | Freq: Two times a day (BID) | ORAL | 0 refills | Status: DC
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
## 2017-07-19 ENCOUNTER — Other Ambulatory Visit: Payer: Self-pay

## 2017-07-19 DIAGNOSIS — Z5321 Procedure and treatment not carried out due to patient leaving prior to being seen by health care provider: Secondary | ICD-10-CM | POA: Insufficient documentation

## 2017-07-19 DIAGNOSIS — R51 Headache: Secondary | ICD-10-CM | POA: Insufficient documentation

## 2017-07-19 NOTE — ED Triage Notes (Addendum)
Pt arrives to ED via POV with c/o headache "off and on" x2 weeks and c/o's "not feeling like myself".  Pt denies c/o N/V/D, no reports of fever, CP or SHOB. Pt reports taking a dose of Keppra for her HA without relief. Pt states she is r/x'd Keppra for seizures, but doesn't take it daily as prescribed. Pt also taking Tylenol OTC without relief. Pt is A&O, in NAD; RR even, regular, and unlabored; skin color/temp is WNL.

## 2017-07-20 ENCOUNTER — Emergency Department
Admission: EM | Admit: 2017-07-20 | Discharge: 2017-07-20 | Disposition: A | Discharge status: Home / Self Care | Payer: Self-pay | Attending: Emergency Medicine | Admitting: Emergency Medicine

## 2017-07-20 NOTE — ED Notes (Signed)
No answer in lobby.

## 2017-07-20 NOTE — ED Notes (Signed)
Pt called  No answer in lobby.

## 2018-04-03 ENCOUNTER — Encounter: Payer: Self-pay | Admitting: Emergency Medicine

## 2018-04-03 ENCOUNTER — Emergency Department
Admission: EM | Admit: 2018-04-03 | Discharge: 2018-04-03 | Disposition: A | Discharge status: Home / Self Care | Payer: Managed Care, Other (non HMO) | Attending: Emergency Medicine | Admitting: Emergency Medicine

## 2018-04-03 DIAGNOSIS — Z859 Personal history of malignant neoplasm, unspecified: Secondary | ICD-10-CM | POA: Diagnosis not present

## 2018-04-03 DIAGNOSIS — R42 Dizziness and giddiness: Secondary | ICD-10-CM | POA: Insufficient documentation

## 2018-04-03 DIAGNOSIS — I509 Heart failure, unspecified: Secondary | ICD-10-CM | POA: Diagnosis not present

## 2018-04-03 DIAGNOSIS — Z79899 Other long term (current) drug therapy: Secondary | ICD-10-CM | POA: Diagnosis not present

## 2018-04-03 MED ORDER — SODIUM CHLORIDE 0.9 % IV SOLN
1000.0000 mL | Freq: Once | INTRAVENOUS | Status: AC
  Administered 2018-04-03: 1000 mL via INTRAVENOUS

## 2018-04-03 MED ORDER — MECLIZINE HCL 25 MG PO TABS: 25.0000 mg | ORAL_TABLET | Freq: Three times a day (TID) | ORAL | 0 refills | Status: DC | PRN

## 2018-04-03 MED ORDER — MECLIZINE HCL 25 MG PO TABS
25.0000 mg | ORAL_TABLET | Freq: Once | ORAL | Status: AC
  Administered 2018-04-03: 25 mg via ORAL
  Filled 2018-04-03 (×2): qty 1

## 2018-04-03 NOTE — ED Provider Notes (Signed)
[facility] Emergency Department Provider Note   ____________________________________________    I have reviewed the triage vital signs and the nursing notes.   HISTORY  Chief Complaint Dizziness    HPI Jill Levy is a 43 y.o. female who presents with complaints of dizziness.  Patient reports she has a history of dysfunctional uterine bleeding, scheduled for an ablation with GYN later this month.  She reports she was at work today and felt as if the room was spinning on her.  She sat down and continued to have a sensation of movement.  No neuro deficits.  No headache.  No history of vertigo.  No nausea or vomiting or head injury.  Not on blood thinners.  Currently feeling significantly better.  She came to the emergency department because she was concerned that maybe her hemoglobin was low.  At her job they checked her hemoglobin and it was 13  Past Medical History:  Diagnosis Date  . Anxiety   . Cancer (HCC) cervical  . CHF (congestive heart failure) ([place])   . Seizure ([place])     There are no active problems to display for this patient.   Past Surgical History:  Procedure Laterality Date  . TUBAL LIGATION      Prior to Admission medications   Medication Sig Start Date End Date Taking? Authorizing Provider  ALPRAZolam Duanne Moron) 0.5 MG tablet Take 0.5 mg by mouth 3 (three) times daily as needed for anxiety.    [provider]  carbamazepine (TEGRETOL) 200 MG tablet Take 200 mg by mouth 2 (two) times daily.    [provider]  CITALOPRAM HYDROBROMIDE PO Take by mouth.    [provider]  clindamycin (CLEOCIN) 150 MG capsule Take 1 capsule (150 mg total) by mouth 4 (four) times daily. 12/19/16   Sable Feil, PA-C  cyclobenzaprine (FLEXERIL) 10 MG tablet Take 1 tablet (10 mg total) by mouth 3 (three) times daily as needed. 12/19/16   Sable Feil, PA-C  cyclobenzaprine (FLEXERIL) 5 MG tablet Take 1 tablet (5 mg  total) by mouth 3 (three) times daily as needed for muscle spasms. Patient not taking: Reported on 08/23/2016 07/26/16   Little, Traci M, PA-C  fexofenadine-pseudoephedrine (ALLEGRA-D) 60-120 MG 12 hr tablet Take 1 tablet by mouth 2 (two) times daily. 12/19/16   Sable Feil, PA-C  ibuprofen (ADVIL,MOTRIN) 600 MG tablet Take 1 tablet (600 mg total) by mouth every 8 (eight) hours as needed. 12/19/16   Sable Feil, PA-C  LevETIRAcetam (KEPPRA PO) Take 750 mg by mouth 2 (two) times daily.     [provider]  meclizine (ANTIVERT) 25 MG tablet Take 1 tablet (25 mg total) by mouth 3 (three) times daily as needed for dizziness. 04/03/18   Lavonia Drafts, MD  traMADol (ULTRAM) 50 MG tablet Take 1 tablet (50 mg total) by mouth every 12 (twelve) hours as needed. 12/19/16   Sable Feil, PA-C     Allergies Amoxapine and related; Coumadin [warfarin sodium]; Lovenox [enoxaparin]; and Penicillins  No family history on file.  Social History Social History   Tobacco Use  . Smoking status: Never Smoker  . Smokeless tobacco: Never Used  Substance Use Topics  . Alcohol use: No  . Drug use: No    Review of Systems  Constitutional: Dizziness as above Eyes: No visual changes ENT: No ringing in the ears Cardiovascular: Denies chest pain. Respiratory: Denies shortness of breath. Gastrointestinal: No abdominal pain.  Genitourinary: Vaginal bleeding Musculoskeletal: Negative for back pain. Skin: Negative for rash. Neurological: Negative for headaches no neuro deficits   ____________________________________________   PHYSICAL EXAM:  VITAL SIGNS: ED Triage Vitals  Enc Vitals Group     BP 04/03/18 1248 135/75     Pulse Rate 04/03/18 1248 81     Resp 04/03/18 1248 20     Temp 04/03/18 1248 98.6 F (37 C)     Temp Source 04/03/18 1248 Oral     SpO2 04/03/18 1248 97 %     Weight 04/03/18 1249 82.1 kg (181 lb)     Height 04/03/18 1249 1.626 m (5\' 4" )     Head Circumference  --      Peak Flow --      Pain Score 04/03/18 1254 0     Pain Loc --      Pain Edu? --      Excl. in [place]? --     Constitutional: Alert and oriented. No acute distress. Pleasant and interactive Eyes: PERRLA, EOMI, several beats of left nystagmus Nose: No congestion/rhinnorhea. Mouth/Throat: Mucous membranes are moist.    Cardiovascular: Normal rate, regular rhythm. Grossly normal heart sounds.  Good peripheral circulation. Respiratory: Normal respiratory effort.  No retractions. Lungs CTAB. Gastrointestinal: Soft and nontender. No distention.  No CVA tenderness.  Musculoskeletal:   Warm and well perfused Neurologic:  Normal speech and language. No gross focal neurologic deficits are appreciated.  Skin:  Skin is warm, dry and intact. No rash noted. Psychiatric: Mood and affect are normal. Speech and behavior are normal.  ____________________________________________   LABS (all labs ordered are listed, but only abnormal results are displayed)  Labs Reviewed - No data to display ____________________________________________  EKG  None ____________________________________________  RADIOLOGY  None ____________________________________________   PROCEDURES  Procedure(s) performed: No  Procedures   Critical Care performed: No ____________________________________________   INITIAL IMPRESSION / ASSESSMENT AND PLAN / ED COURSE  Pertinent labs & imaging results that were available during my care of the patient were reviewed by me and considered in my medical decision making (see chart for details).  Patient presents with symptoms of vertigo.  Treated with IV fluids, meclizine with significant improvement.  Hemoglobin was checked at her job and was normal at 48.  Strongly suspect BPV will treat with meclizine, ENT follow-up as needed.    ____________________________________________   FINAL CLINICAL IMPRESSION(S) / ED DIAGNOSES  Final diagnoses:  Vertigo         Note:  This document was prepared using Dragon voice recognition software and may include unintentional dictation errors.   Lavonia Drafts, MD 04/03/18 [phone]

## 2018-04-03 NOTE — ED Notes (Signed)
Spoke with Schaevitz MD in regards to patient presentation. No new orders at this time.

## 2018-04-03 NOTE — ED Triage Notes (Signed)
Brought by ems for feeling lightheaded at work today.  Has history of heavy vaginal bleeding for months and has hysterectomy scheduled.  She takes iron pills.

## 2018-04-03 NOTE — ED Triage Notes (Signed)
Patient presents to ED via [facility] from [facility] due to vaginal bleeding. Patient states "I've had a period non stop for 6 months". Patient reports being seen by her OBGYN and she has an ablation scheduled for January 7th . Patient reports [facility] sent her over to "make sure her counts don't drop". Patient reports they checked her hemoglobin there and it was 13. Patient denies dizziness at this time. Patient reports anxiety.

## 2018-04-03 NOTE — ED Notes (Signed)

## 2018-05-11 ENCOUNTER — Emergency Department
Admission: EM | Admit: 2018-05-11 | Discharge: 2018-05-12 | Disposition: A | Discharge status: Home / Self Care | Payer: Managed Care, Other (non HMO) | Attending: Emergency Medicine | Admitting: Emergency Medicine

## 2018-05-11 ENCOUNTER — Other Ambulatory Visit: Payer: Self-pay

## 2018-05-11 ENCOUNTER — Encounter: Payer: Self-pay | Admitting: Emergency Medicine

## 2018-05-11 DIAGNOSIS — T7421XA Adult sexual abuse, confirmed, initial encounter: Secondary | ICD-10-CM | POA: Diagnosis present

## 2018-05-11 DIAGNOSIS — Z859 Personal history of malignant neoplasm, unspecified: Secondary | ICD-10-CM | POA: Insufficient documentation

## 2018-05-11 DIAGNOSIS — Z79899 Other long term (current) drug therapy: Secondary | ICD-10-CM | POA: Insufficient documentation

## 2018-05-11 MED ORDER — OXYCODONE-ACETAMINOPHEN 5-325 MG PO TABS
1.0000 | ORAL_TABLET | Freq: Once | ORAL | Status: AC
  Administered 2018-05-11: 1 via ORAL
  Filled 2018-05-11: qty 1
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
## 2018-05-11 NOTE — ED Notes (Signed)
ED Provider at bedside. 

## 2018-05-11 NOTE — ED Notes (Signed)
Sane nurse at bedside

## 2018-05-12 LAB — COMPREHENSIVE METABOLIC PANEL
ALK PHOS: 82 U/L (ref 38–126)
ALT: 31 U/L (ref 0–44)
ANION GAP: 8 (ref 5–15)
AST: 23 U/L (ref 15–41)
Albumin: 4.2 g/dL (ref 3.5–5.0)
BILIRUBIN TOTAL: 0.3 mg/dL (ref 0.3–1.2)
BUN: 13 mg/dL (ref 6–20)
CALCIUM: 9.9 mg/dL (ref 8.9–10.3)
CO2: 25 mmol/L (ref 22–32)
Chloride: 105 mmol/L (ref 98–111)
Creatinine, Ser: 0.74 mg/dL (ref 0.44–1.00)
GFR calc Af Amer: >60 mL/min (ref >60)
GLUCOSE: 105 mg/dL — AB (ref 70–99)
Potassium: 4 mmol/L (ref 3.5–5.1)
Sodium: 138 mmol/L (ref 135–145)
TOTAL PROTEIN: 8.1 g/dL (ref 6.5–8.1)

## 2018-05-12 LAB — RAPID HIV SCREEN (HIV 1/2 AB+AG)
HIV 1/2 Antibodies: NONREACTIVE
HIV-1 P24 ANTIGEN - HIV24: NONREACTIVE

## 2018-05-12 MED ORDER — ELVITEG-COBIC-EMTRICIT-TENOFAF 150-150-200-10 MG PO TABS: 1.0000 | ORAL_TABLET | Freq: Every day | ORAL | 0 refills | Status: DC

## 2018-05-12 MED ORDER — AZITHROMYCIN 500 MG PO TABS
1000.0000 mg | ORAL_TABLET | Freq: Once | ORAL | Status: AC
  Administered 2018-05-12: 1000 mg via ORAL
  Filled 2018-05-12: qty 2

## 2018-05-12 MED ORDER — METRONIDAZOLE 500 MG PO TABS
2000.0000 mg | ORAL_TABLET | Freq: Once | ORAL | Status: AC
  Administered 2018-05-12: 2000 mg via ORAL
  Filled 2018-05-12: qty 4

## 2018-05-12 MED ORDER — ELVITEG-COBIC-EMTRICIT-TENOFAF 150-150-200-10 MG PREPACK
5.0000 | ORAL_TABLET | Freq: Once | ORAL | Status: AC
  Administered 2018-05-12: 5 via ORAL
  Filled 2018-05-12: qty 1
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
## 2018-05-12 NOTE — ED Notes (Signed)
Sane RN took over care for patient

## 2018-05-12 NOTE — SANE Note (Signed)
Follow-up Phone Call  Patient gives verbal consent for a FNE/SANE follow-up phone call in 48-72 hours: No Patient's telephone number: [phone] Patient gives verbal consent to leave voicemail at the phone number listed above: No DO NOT CALL between the hours of: No consent to call

## 2018-05-12 NOTE — ED Notes (Signed)
[employer] officer at bedside 
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
## 2018-05-12 NOTE — ED Notes (Signed)
[facility] personnel at bedside

## 2018-05-13 LAB — HEPATITIS B SURFACE ANTIGEN: HEP B S AG: NEGATIVE

## 2018-05-13 LAB — HEPATITIS C ANTIBODY: HCV AB: 0.1 {s_co_ratio} (ref 0.0–0.9)
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
## 2018-05-14 LAB — POCT PREGNANCY, URINE: Preg Test, Ur: NEGATIVE

## 2018-05-14 MED FILL — GENVOYA TABLET: 150-150-200 | 30 days supply | Qty: 30 | Fill #0

## 2018-05-16 LAB — RPR: RPR: NONREACTIVE

## 2018-09-09 ENCOUNTER — Emergency Department
Admission: EM | Admit: 2018-09-09 | Discharge: 2018-09-09 | Disposition: A | Discharge status: Home / Self Care | Payer: Managed Care, Other (non HMO) | Attending: Emergency Medicine | Admitting: Emergency Medicine

## 2018-09-09 ENCOUNTER — Other Ambulatory Visit: Payer: Self-pay

## 2018-09-09 DIAGNOSIS — R569 Unspecified convulsions: Secondary | ICD-10-CM | POA: Insufficient documentation

## 2018-09-09 DIAGNOSIS — Z760 Encounter for issue of repeat prescription: Secondary | ICD-10-CM | POA: Diagnosis not present

## 2018-09-09 DIAGNOSIS — I509 Heart failure, unspecified: Secondary | ICD-10-CM | POA: Diagnosis not present

## 2018-09-09 DIAGNOSIS — G40909 Epilepsy, unspecified, not intractable, without status epilepticus: Secondary | ICD-10-CM

## 2018-09-09 DIAGNOSIS — F419 Anxiety disorder, unspecified: Secondary | ICD-10-CM | POA: Diagnosis not present

## 2018-09-09 MED ORDER — LEVETIRACETAM 750 MG PO TABS
750.0000 mg | ORAL_TABLET | ORAL | Status: AC
  Administered 2018-09-09: 750 mg via ORAL
  Filled 2018-09-09: qty 1

## 2018-09-09 MED ORDER — LEVETIRACETAM 750 MG PO TABS: 750.0000 mg | ORAL_TABLET | Freq: Two times a day (BID) | ORAL | 2 refills | Status: DC

## 2018-09-09 NOTE — ED Triage Notes (Signed)
Pt states she is here for a keppra dose. Pt states she has not had keppra medication in three months. Pt states she takes 700mg  keppra TID. Pt states "I don't wan to have a seizure".

## 2018-09-09 NOTE — ED Provider Notes (Signed)
[facility] Emergency Department Provider Note  ____________________________________________   First MD Initiated Contact with Patient 09/09/18 [phone]     (approximate)  I have reviewed the triage vital signs and the nursing notes.   HISTORY  Chief Complaint Medication Refill    HPI Jill Levy is a 43 y.o. female with medical history as listed below who presents for medication refill.  She says that she has been on Keppra 750 mg twice a day for years.  She has been unable to see her primary care provider since the start of the coronavirus pandemic and has been out of the medication "for a long time", possibly as much as 3 months.  She said that when she starts to feel "cloudy" she knows that she needs to get some medicine before she has another seizure.  She has not had any seizures recently.  She denies any exposure to coronavirus patients.  She denies fever/chills, headache, sore throat, chest pain, shortness of breath, cough, nausea, vomiting, and abdominal pain.  She has no medical complaints or concerns at this time, she just feels that she needs her medicine.         Past Medical History:  Diagnosis Date  . Anxiety   . Cancer (HCC) cervical  . CHF (congestive heart failure) ([place])   . Seizure ([place])     There are no active problems to display for this patient.   Past Surgical History:  Procedure Laterality Date  . TUBAL LIGATION      Prior to Admission medications   Medication Sig Start Date End Date Taking? Authorizing Provider  carbamazepine (TEGRETOL) 200 MG tablet Take 200 mg by mouth 2 (two) times daily.   Yes [provider]  CITALOPRAM HYDROBROMIDE PO Take by mouth.   Yes [provider]  ALPRAZolam Duanne Moron) 0.5 MG tablet Take 0.5 mg by mouth 3 (three) times daily as needed for anxiety.    [provider]  clindamycin (CLEOCIN) 150 MG capsule Take 1 capsule (150 mg total) by mouth 4 (four) times daily.  12/19/16   Sable Feil, PA-C  cyclobenzaprine (FLEXERIL) 10 MG tablet Take 1 tablet (10 mg total) by mouth 3 (three) times daily as needed. 12/19/16   Sable Feil, PA-C  cyclobenzaprine (FLEXERIL) 5 MG tablet Take 1 tablet (5 mg total) by mouth 3 (three) times daily as needed for muscle spasms. Patient not taking: Reported on 08/23/2016 07/26/16   Little, Traci M, PA-C  elvitegravir-cobicistat-emtricitabine-tenofovir (GENVOYA) 150-150-200-10 MG TABS tablet Take 1 tablet by mouth daily with breakfast. 05/12/18   Gregor Hams, MD  fexofenadine-pseudoephedrine (ALLEGRA-D) 60-120 MG 12 hr tablet Take 1 tablet by mouth 2 (two) times daily. 12/19/16   Sable Feil, PA-C  ibuprofen (ADVIL,MOTRIN) 600 MG tablet Take 1 tablet (600 mg total) by mouth every 8 (eight) hours as needed. 12/19/16   Sable Feil, PA-C  levETIRAcetam (KEPPRA) 750 MG tablet Take 1 tablet (750 mg total) by mouth 2 (two) times daily. 09/09/18   Hinda Kehr, MD  meclizine (ANTIVERT) 25 MG tablet Take 1 tablet (25 mg total) by mouth 3 (three) times daily as needed for dizziness. 04/03/18   Lavonia Drafts, MD  traMADol (ULTRAM) 50 MG tablet Take 1 tablet (50 mg total) by mouth every 12 (twelve) hours as needed. 12/19/16   Sable Feil, PA-C    Allergies Amoxapine and related, Coumadin [warfarin sodium], Lovenox [enoxaparin], and Penicillins  No family history on file.  Social History Social History   Tobacco Use  . Smoking status: Never Smoker  . Smokeless tobacco: Never Used  Substance Use Topics  . Alcohol use: No  . Drug use: No    Review of Systems Constitutional: No fever/chills Eyes: No visual changes. ENT: No sore throat. Cardiovascular: Denies chest pain. Respiratory: Denies shortness of breath. Gastrointestinal: No abdominal pain.  No nausea, no vomiting.  No diarrhea.  No constipation. Genitourinary: Negative for dysuria. Musculoskeletal: Negative for neck pain.  Negative for back pain.  Integumentary: Negative for rash. Neurological: Negative for headaches, focal weakness or numbness.  States that she feels "cloudy".   ____________________________________________   PHYSICAL EXAM:  VITAL SIGNS: ED Triage Vitals  Enc Vitals Group     BP 09/09/18 0357 123/77     Pulse Rate 09/09/18 0357 83     Resp 09/09/18 0357 17     Temp 09/09/18 0357 98.7 F (37.1 C)     Temp Source 09/09/18 0357 Oral     SpO2 09/09/18 0357 99 %     Weight 09/09/18 0400 83 kg (183 lb)     Height 09/09/18 0400 1.626 m (5\' 4" )     Head Circumference --      Peak Flow --      Pain Score 09/09/18 0400 0     Pain Loc --      Pain Edu? --      Excl. in [place]? --     Constitutional: Alert and oriented. Well appearing and in no acute distress. Eyes: Conjunctivae are normal.  Neck: No stridor.  No meningeal signs.   Cardiovascular: Normal rate, regular rhythm. Good peripheral circulation. Respiratory: Normal respiratory effort.  No retractions.  Musculoskeletal: No lower extremity tenderness nor edema. No gross deformities of extremities. Neurologic:  Normal speech and language. No gross focal neurologic deficits are appreciated.  Skin:  Skin is warm, dry and intact. No rash noted. Psychiatric: Mood and affect are normal. Speech and behavior are normal.  ____________________________________________   LABS (all labs ordered are listed, but only abnormal results are displayed)  Labs Reviewed - No data to display ____________________________________________  EKG  No indication for EKG ____________________________________________  RADIOLOGY   ED MD interpretation: No indication for imaging  Official radiology report(s): No results found.  ____________________________________________   PROCEDURES   Procedure(s) performed (including Critical Care):  Procedures   ____________________________________________   INITIAL IMPRESSION / MDM / [place] / ED COURSE  As  part of my medical decision making, I reviewed the following data within the [facility] notes reviewed and incorporated, Old chart reviewed, Notes from prior ED visits and [place] Controlled Substance Database        I reviewed the patient's medical record extensively to the best of my ability and care everywhere as well as in the [facility] system.  I find a number of references to her seizure disorder and even some references in old notes from [facility] about her taking Keppra, but I cannot find any verification of the dosage.  Apparently this is because her provider does not use epic or the notes are otherwise unavailable.  However, this is not commonly a drug of abuse, and I find no warning signs or symptoms that indicate to me I should not provide a prescription for this medicine.  As result, I have ordered a first dose of Keppra 750 mg by mouth now and give her a prescription for 750 mg twice daily as  well as a couple of refills.  I gave her my usual recommendations in terms of receiving her refills and medication management from her primary care doctor but at this point I feel like the benefits to her are greater than the risk of possible abuse of an antiepileptic drug.  She is comfortable with the plan.  I gave my usual and customary return precautions.      ____________________________________________  FINAL CLINICAL IMPRESSION(S) / ED DIAGNOSES  Final diagnoses:  Seizure disorder ([place])  Medication refill     MEDICATIONS GIVEN DURING THIS VISIT:  Medications  levETIRAcetam (KEPPRA) tablet 750 mg (has no administration in time range)     ED Discharge Orders         Ordered    levETIRAcetam (KEPPRA) 750 MG tablet  2 times daily    Note to Pharmacy: If 750 mg tablets are unavailable, please change prescription to 500 mg tablets, taking 1.5 tablets (750 mg) PO BID, dispense 45 tablets, 2 refills.   09/09/18 0541          *Please note:  Takisha N Hosley was  evaluated in Emergency Department on 09/09/2018 for the symptoms described in the history of present illness. She was evaluated in the context of the global COVID-19 pandemic, which necessitated consideration that the patient might be at risk for infection with the SARS-CoV-2 virus that causes COVID-19. Institutional protocols and algorithms that pertain to the evaluation of patients at risk for COVID-19 are in a state of rapid change based on information released by regulatory bodies including the CDC and federal and state organizations. These policies and algorithms were followed during the patient's care in the ED.  Some ED evaluations and interventions may be delayed as a result of limited staffing during the pandemic.*  Note:  This document was prepared using Dragon voice recognition software and may include unintentional dictation errors.   Hinda Kehr, MD 09/09/18 [phone]

## 2018-09-09 NOTE — Discharge Instructions (Signed)
As we discussed, it is important that your medications are managed by your primary care doctor.  Given that it is important to stay on your seizure medicine, however, I gave you a dose in the emergency department and gave you a prescription.  However I recommend that you follow-up with your doctor as soon as possible to discuss your medication management for your seizures as well as any other medical conditions that may concern you.  Return to the emergency department if you develop new or worsening symptoms that concern you.

## 2019-02-27 ENCOUNTER — Encounter: Payer: Self-pay | Admitting: *Deleted

## 2019-02-27 ENCOUNTER — Other Ambulatory Visit: Payer: Self-pay

## 2019-02-27 ENCOUNTER — Emergency Department: Payer: Managed Care, Other (non HMO)

## 2019-02-27 DIAGNOSIS — R55 Syncope and collapse: Secondary | ICD-10-CM | POA: Insufficient documentation

## 2019-02-27 DIAGNOSIS — Z5321 Procedure and treatment not carried out due to patient leaving prior to being seen by health care provider: Secondary | ICD-10-CM | POA: Diagnosis not present

## 2019-02-27 LAB — CBC
HCT: 41.3 % (ref 36.0–46.0)
Hemoglobin: 13.1 g/dL (ref 12.0–15.0)
MCH: 27 pg (ref 26.0–34.0)
MCHC: 31.7 g/dL (ref 30.0–36.0)
MCV: 85 fL (ref 80.0–100.0)
Platelets: 213 10*3/uL (ref 150–400)
RBC: 4.86 MIL/uL (ref 3.87–5.11)
RDW: 14.8 % (ref 11.5–15.5)
WBC: 10.2 10*3/uL (ref 4.0–10.5)
nRBC: 0 % (ref 0.0–0.2)

## 2019-02-27 LAB — BASIC METABOLIC PANEL
Anion gap: 13 (ref 5–15)
BUN: 15 mg/dL (ref 6–20)
CO2: 21 mmol/L — ABNORMAL LOW (ref 22–32)
Calcium: 9.6 mg/dL (ref 8.9–10.3)
Chloride: 106 mmol/L (ref 98–111)
Creatinine, Ser: 0.71 mg/dL (ref 0.44–1.00)
GFR calc Af Amer: >60 mL/min (ref >60)
GFR calc non Af Amer: >60 mL/min (ref >60)
Glucose, Bld: 94 mg/dL (ref 70–99)
Potassium: 3.5 mmol/L (ref 3.5–5.1)
Sodium: 140 mmol/L (ref 135–145)

## 2019-02-27 LAB — TROPONIN I (HIGH SENSITIVITY): Troponin I (High Sensitivity): <2 ng/L (ref <18)

## 2019-02-27 MED ORDER — SODIUM CHLORIDE 0.9% FLUSH: 3.0000 mL | Freq: Once | INTRAVENOUS | Status: DC

## 2019-02-27 NOTE — ED Triage Notes (Signed)
Pt to triage via wheelchair.  Pt brought in via ems to triage.  Pt reports syncopal episode tonight while wrapping presents.  Hx seizures.  Pt has a headache.  Pt fell back and hit her head on the coffee table when she passed out.  No lac/abrasion.  Pt alert  Speech clear.

## 2019-02-28 ENCOUNTER — Emergency Department
Admission: EM | Admit: 2019-02-28 | Discharge: 2019-02-28 | Disposition: A | Discharge status: Home / Self Care | Payer: Managed Care, Other (non HMO) | Attending: Emergency Medicine | Admitting: Emergency Medicine

## 2019-02-28 NOTE — ED Notes (Signed)
No answer when called several times from lobby 

## 2019-03-07 ENCOUNTER — Telehealth: Payer: Self-pay | Admitting: Emergency Medicine

## 2019-03-07 NOTE — Telephone Encounter (Signed)
Called patient due to lwot to inquire about condition and follow up plans.

## 2019-03-13 ENCOUNTER — Emergency Department
Admission: EM | Admit: 2019-03-13 | Discharge: 2019-03-13 | Disposition: A | Discharge status: Home / Self Care | Payer: Managed Care, Other (non HMO) | Attending: Emergency Medicine | Admitting: Emergency Medicine

## 2019-03-13 ENCOUNTER — Other Ambulatory Visit: Payer: Self-pay

## 2019-03-13 ENCOUNTER — Encounter: Payer: Self-pay | Admitting: Emergency Medicine

## 2019-03-13 DIAGNOSIS — R52 Pain, unspecified: Secondary | ICD-10-CM | POA: Diagnosis present

## 2019-03-13 DIAGNOSIS — I509 Heart failure, unspecified: Secondary | ICD-10-CM | POA: Insufficient documentation

## 2019-03-13 DIAGNOSIS — J111 Influenza due to unidentified influenza virus with other respiratory manifestations: Secondary | ICD-10-CM | POA: Insufficient documentation

## 2019-03-13 DIAGNOSIS — Z79899 Other long term (current) drug therapy: Secondary | ICD-10-CM | POA: Diagnosis not present

## 2019-03-13 DIAGNOSIS — Z03818 Encounter for observation for suspected exposure to other biological agents ruled out: Secondary | ICD-10-CM | POA: Diagnosis not present

## 2019-03-13 DIAGNOSIS — R5383 Other fatigue: Secondary | ICD-10-CM | POA: Diagnosis not present

## 2019-03-13 LAB — BASIC METABOLIC PANEL
Anion gap: 8 (ref 5–15)
BUN: 15 mg/dL (ref 6–20)
CO2: 26 mmol/L (ref 22–32)
Calcium: 9.7 mg/dL (ref 8.9–10.3)
Chloride: 106 mmol/L (ref 98–111)
Creatinine, Ser: 0.82 mg/dL (ref 0.44–1.00)
GFR calc Af Amer: >60 mL/min (ref >60)
GFR calc non Af Amer: >60 mL/min (ref >60)
Glucose, Bld: 98 mg/dL (ref 70–99)
Potassium: 4.6 mmol/L (ref 3.5–5.1)
Sodium: 140 mmol/L (ref 135–145)

## 2019-03-13 LAB — URINALYSIS, COMPLETE (UACMP) WITH MICROSCOPIC
Bacteria, UA: NONE SEEN
Bilirubin Urine: NEGATIVE
Glucose, UA: NEGATIVE mg/dL
Hgb urine dipstick: NEGATIVE
Ketones, ur: NEGATIVE mg/dL
Leukocytes,Ua: NEGATIVE
Nitrite: NEGATIVE
Protein, ur: NEGATIVE mg/dL
Specific Gravity, Urine: 1.025 (ref 1.005–1.030)
pH: 6 (ref 5.0–8.0)

## 2019-03-13 LAB — HEPATIC FUNCTION PANEL
ALT: 17 U/L (ref 0–44)
AST: 17 U/L (ref 15–41)
Albumin: 4 g/dL (ref 3.5–5.0)
Alkaline Phosphatase: 80 U/L (ref 38–126)
Bilirubin, Direct: <0.1 mg/dL (ref 0.0–0.2)
Total Bilirubin: 0.3 mg/dL (ref 0.3–1.2)
Total Protein: 7.4 g/dL (ref 6.5–8.1)

## 2019-03-13 LAB — CBC
HCT: 41.5 % (ref 36.0–46.0)
Hemoglobin: 12.9 g/dL (ref 12.0–15.0)
MCH: 26.7 pg (ref 26.0–34.0)
MCHC: 31.1 g/dL (ref 30.0–36.0)
MCV: 85.9 fL (ref 80.0–100.0)
Platelets: 201 10*3/uL (ref 150–400)
RBC: 4.83 MIL/uL (ref 3.87–5.11)
RDW: 14.7 % (ref 11.5–15.5)
WBC: 10.6 10*3/uL — ABNORMAL HIGH (ref 4.0–10.5)
nRBC: 0 % (ref 0.0–0.2)

## 2019-03-13 LAB — LIPASE, BLOOD: Lipase: 35 U/L (ref 11–51)

## 2019-03-13 MED ORDER — NAPROXEN 500 MG PO TABS: 500.0000 mg | ORAL_TABLET | Freq: Two times a day (BID) | ORAL | 0 refills | Status: DC

## 2019-03-13 MED ORDER — ONDANSETRON 4 MG PO TBDP
8.0000 mg | ORAL_TABLET | Freq: Once | ORAL | Status: AC
  Administered 2019-03-13: 8 mg via ORAL
  Filled 2019-03-13: qty 2

## 2019-03-13 MED ORDER — FAMOTIDINE 20 MG PO TABS
40.0000 mg | ORAL_TABLET | Freq: Once | ORAL | Status: AC
  Administered 2019-03-13: 13:00:00 40 mg via ORAL
  Filled 2019-03-13: qty 2

## 2019-03-13 MED ORDER — FAMOTIDINE 20 MG PO TABS: 20.0000 mg | ORAL_TABLET | Freq: Two times a day (BID) | ORAL | 0 refills | Status: DC

## 2019-03-13 MED ORDER — ONDANSETRON 4 MG PO TBDP: 4.0000 mg | ORAL_TABLET | Freq: Three times a day (TID) | ORAL | 0 refills | Status: DC | PRN

## 2019-03-13 NOTE — ED Provider Notes (Signed)
[facility] Emergency Department Provider Note  ____________________________________________  Time seen: Approximately 1:24 PM  I have reviewed the triage vital signs and the nursing notes.   HISTORY  Chief Complaint Weakness, Anorexia, Emesis, and Shortness of Breath    HPI Jill Levy is a 44 y.o. female with a past history of anxiety, CHF and seizure disorder who presents with generalized weakness, shortness of breath, loss of appetite over the last 3 days. Also change in bowel movements, malaise. No chest pain. Denies history of DVT or PE. No vomiting fevers or chills.  She notes a Covid exposure at work about 2 weeks ago. She has had 2 Covid tests, last one was about 2 days ago, resulted yesterday is negative according to the patient.      Past Medical History:  Diagnosis Date  . Anxiety   . Cancer (HCC) cervical  . CHF (congestive heart failure) ([place])   . Seizure ([place])      There are no problems to display for this patient.    Past Surgical History:  Procedure Laterality Date  . TUBAL LIGATION       Prior to Admission medications   Medication Sig Start Date End Date Taking? Authorizing Provider  ALPRAZolam Duanne Moron) 0.5 MG tablet Take 0.5 mg by mouth 3 (three) times daily as needed for anxiety.    [provider]  carbamazepine (TEGRETOL) 200 MG tablet Take 200 mg by mouth 2 (two) times daily.    [provider]  CITALOPRAM HYDROBROMIDE PO Take by mouth.    [provider]  clindamycin (CLEOCIN) 150 MG capsule Take 1 capsule (150 mg total) by mouth 4 (four) times daily. 12/19/16   Sable Feil, PA-C  cyclobenzaprine (FLEXERIL) 10 MG tablet Take 1 tablet (10 mg total) by mouth 3 (three) times daily as needed. 12/19/16   Sable Feil, PA-C  cyclobenzaprine (FLEXERIL) 5 MG tablet Take 1 tablet (5 mg total) by mouth 3 (three) times daily as needed for muscle spasms. Patient not taking: Reported on  08/23/2016 07/26/16   Little, Traci M, PA-C  elvitegravir-cobicistat-emtricitabine-tenofovir (GENVOYA) 150-150-200-10 MG TABS tablet Take 1 tablet by mouth daily with breakfast. 05/12/18   Gregor Hams, MD  famotidine (PEPCID) 20 MG tablet Take 1 tablet (20 mg total) by mouth 2 (two) times daily. 03/13/19   Carrie Mew, MD  fexofenadine-pseudoephedrine (ALLEGRA-D) 60-120 MG 12 hr tablet Take 1 tablet by mouth 2 (two) times daily. 12/19/16   Sable Feil, PA-C  ibuprofen (ADVIL,MOTRIN) 600 MG tablet Take 1 tablet (600 mg total) by mouth every 8 (eight) hours as needed. 12/19/16   Sable Feil, PA-C  levETIRAcetam (KEPPRA) 750 MG tablet Take 1 tablet (750 mg total) by mouth 2 (two) times daily. 09/09/18   Hinda Kehr, MD  meclizine (ANTIVERT) 25 MG tablet Take 1 tablet (25 mg total) by mouth 3 (three) times daily as needed for dizziness. 04/03/18   Lavonia Drafts, MD  naproxen (NAPROSYN) 500 MG tablet Take 1 tablet (500 mg total) by mouth 2 (two) times daily with a meal. 03/13/19   Carrie Mew, MD  ondansetron (ZOFRAN ODT) 4 MG disintegrating tablet Take 1 tablet (4 mg total) by mouth every 8 (eight) hours as needed for nausea or vomiting. 03/13/19   Carrie Mew, MD  traMADol (ULTRAM) 50 MG tablet Take 1 tablet (50 mg total) by mouth every 12 (twelve) hours as needed. 12/19/16   Sable Feil, PA-C  Allergies Amoxapine and related, Coumadin [warfarin sodium], Lovenox [enoxaparin], and Penicillins   No family history on file.  Social History Social History   Tobacco Use  . Smoking status: Never Smoker  . Smokeless tobacco: Never Used  Substance Use Topics  . Alcohol use: No  . Drug use: No    Review of Systems  Constitutional:   No fever or chills.  ENT:   No sore throat. No rhinorrhea. Cardiovascular:   No chest pain or syncope. Respiratory: Positive shortness of breath without cough. Gastrointestinal:   Negative for abdominal pain, vomiting and diarrhea.   Musculoskeletal:   Negative for focal pain or swelling All other systems reviewed and are negative except as documented above in ROS and HPI.  ____________________________________________   PHYSICAL EXAM:  VITAL SIGNS: ED Triage Vitals  Enc Vitals Group     BP 03/13/19 0858 (!) 119/51     Pulse Rate 03/13/19 0858 98     Resp 03/13/19 0858 20     Temp 03/13/19 0858 99 F (37.2 C)     Temp Source 03/13/19 0858 Oral     SpO2 03/13/19 0858 100 %     Weight 03/13/19 0859 181 lb (82.1 kg)     Height 03/13/19 0859 5\' 4"  (1.626 m)     Head Circumference --      Peak Flow --      Pain Score 03/13/19 0859 9     Pain Loc --      Pain Edu? --      Excl. in [place]? --     Vital signs reviewed, nursing assessments reviewed.   Constitutional:   Alert and oriented. Non-toxic appearance. Eyes:   Conjunctivae are normal. EOMI. PERRL. ENT      Head:   Normocephalic and atraumatic.      Nose:   Wearing a mask.      Mouth/Throat:   Wearing a mask.      Neck:   No meningismus. Full ROM. Hematological/Lymphatic/Immunilogical:   No cervical lymphadenopathy. Cardiovascular:   RRR. Symmetric bilateral radial and DP pulses.  No murmurs. Cap refill less than 2 seconds. Respiratory:   Normal respiratory effort without tachypnea/retractions. Breath sounds are clear and equal bilaterally. No wheezes/rales/rhonchi. Gastrointestinal:   Soft and nontender. Non distended. There is no CVA tenderness.  No rebound, rigidity, or guarding. Genitourinary:   deferred Musculoskeletal:   Normal range of motion in all extremities. No joint effusions.  No lower extremity tenderness.  No edema. Neurologic:   Normal speech and language.  Motor grossly intact. No acute focal neurologic deficits are appreciated.  Skin:    Skin is warm, dry and intact. No rash noted.  No petechiae, purpura, or bullae.  ____________________________________________    LABS (pertinent positives/negatives) (all labs ordered are listed,  but only abnormal results are displayed) Labs Reviewed  CBC - Abnormal; Notable for the following components:      Result Value   WBC 10.6 (*)    All other components within normal limits  URINALYSIS, COMPLETE (UACMP) WITH MICROSCOPIC - Abnormal; Notable for the following components:   Color, Urine YELLOW (*)    APPearance CLOUDY (*)    All other components within normal limits  BASIC METABOLIC PANEL  HEPATIC FUNCTION PANEL  LIPASE, BLOOD   ____________________________________________   EKG  Interpreted by me Normal sinus rhythm rate of 92, normal axis intervals QRS ST segments and T waves  ____________________________________________    RADIOLOGY  No results found.  ____________________________________________   PROCEDURES Procedures  ____________________________________________    CLINICAL IMPRESSION / ASSESSMENT AND PLAN / ED COURSE  Medications ordered in the ED: Medications  famotidine (PEPCID) tablet 40 mg (40 mg Oral Given 03/13/19 1303)  ondansetron (ZOFRAN-ODT) disintegrating tablet 8 mg (8 mg Oral Given 03/13/19 1303)    Pertinent labs & imaging results that were available during my care of the patient were reviewed by me and considered in my medical decision making (see chart for details).  Jill Levy was evaluated in Emergency Department on 03/13/2019 for the symptoms described in the history of present illness. She was evaluated in the context of the global COVID-19 pandemic, which necessitated consideration that the patient might be at risk for infection with the SARS-CoV-2 virus that causes COVID-19. Institutional protocols and algorithms that pertain to the evaluation of patients at risk for COVID-19 are in a state of rapid change based on information released by regulatory bodies including the CDC and federal and state organizations. These policies and algorithms were followed during the patient's care in the ED.   Patient presents with  influenza-like illness, already ruled out for Covid recently. Vital signs are normal, exam is benign and reassuring. Vital signs are normal.  Considering the patient's symptoms, medical history, and physical examination today, I have low suspicion for ACS, PE, TAD, pneumothorax, carditis, mediastinitis, pneumonia, CHF, or sepsis.  Highly suspect a viral syndrome. Recommend home care, hydration, rest, Zofran naproxen famotidine, return precautions.      ____________________________________________   FINAL CLINICAL IMPRESSION(S) / ED DIAGNOSES    Final diagnoses:  Influenza-like illness  Fatigue and malaise   ED Discharge Orders         Ordered    naproxen (NAPROSYN) 500 MG tablet  2 times daily with meals     03/13/19 1323    famotidine (PEPCID) 20 MG tablet  2 times daily     03/13/19 1323    ondansetron (ZOFRAN ODT) 4 MG disintegrating tablet  Every 8 hours PRN     03/13/19 1323          Portions of this note were generated with dragon dictation software. Dictation errors may occur despite best attempts at proofreading.   Carrie Mew, MD 03/13/19 1327

## 2019-03-13 NOTE — ED Triage Notes (Signed)
Pt reports feels weak, SOB and has had a loss of appetite over the last 3 days. Pt also reports some SOB at times.

## 2019-03-13 NOTE — ED Notes (Signed)
Pt instructed to try and eat crackers and water about 15 minutes after med administration per MD Joni Fears orders. Pt verbalizes understanding of plan. This RN will continue to monitor.

## 2019-03-13 NOTE — Discharge Instructions (Signed)
Your lab test today were all normal. Please continue to follow-up with your doctor to monitor your symptoms. Focus on good hydration and rest in the meantime, and return to the emergency room if you are having any worsening symptoms.

## 2019-03-13 NOTE — ED Notes (Signed)
Pt successfully able to tolerate PO food and liquid.

## 2019-03-28 ENCOUNTER — Other Ambulatory Visit: Payer: Self-pay | Admitting: Family Medicine

## 2019-03-28 DIAGNOSIS — K219 Gastro-esophageal reflux disease without esophagitis: Secondary | ICD-10-CM

## 2019-03-28 DIAGNOSIS — R6881 Early satiety: Secondary | ICD-10-CM

## 2019-03-28 DIAGNOSIS — R1031 Right lower quadrant pain: Secondary | ICD-10-CM

## 2019-04-05 ENCOUNTER — Ambulatory Visit: Payer: Managed Care, Other (non HMO) | Attending: Family Medicine

## 2019-06-03 ENCOUNTER — Other Ambulatory Visit: Payer: Self-pay | Admitting: Physician Assistant

## 2019-06-03 DIAGNOSIS — Z01818 Encounter for other preprocedural examination: Secondary | ICD-10-CM

## 2019-06-26 ENCOUNTER — Emergency Department
Admission: EM | Admit: 2019-06-26 | Discharge: 2019-06-26 | Disposition: A | Discharge status: Home / Self Care | Payer: Managed Care, Other (non HMO) | Attending: Emergency Medicine | Admitting: Emergency Medicine

## 2019-06-26 ENCOUNTER — Emergency Department: Payer: Managed Care, Other (non HMO)

## 2019-06-26 ENCOUNTER — Other Ambulatory Visit: Payer: Self-pay

## 2019-06-26 DIAGNOSIS — Z5321 Procedure and treatment not carried out due to patient leaving prior to being seen by health care provider: Secondary | ICD-10-CM | POA: Diagnosis not present

## 2019-06-26 DIAGNOSIS — R0602 Shortness of breath: Secondary | ICD-10-CM | POA: Insufficient documentation

## 2019-06-26 DIAGNOSIS — R0789 Other chest pain: Secondary | ICD-10-CM | POA: Insufficient documentation

## 2019-06-26 LAB — BASIC METABOLIC PANEL
Anion gap: 7 (ref 5–15)
BUN: 14 mg/dL (ref 6–20)
CO2: 22 mmol/L (ref 22–32)
Calcium: 9.7 mg/dL (ref 8.9–10.3)
Chloride: 109 mmol/L (ref 98–111)
Creatinine, Ser: 0.74 mg/dL (ref 0.44–1.00)
GFR calc Af Amer: >60 mL/min (ref >60)
GFR calc non Af Amer: >60 mL/min (ref >60)
Glucose, Bld: 86 mg/dL (ref 70–99)
Potassium: 3.7 mmol/L (ref 3.5–5.1)
Sodium: 138 mmol/L (ref 135–145)

## 2019-06-26 LAB — CBC
HCT: 45.4 % (ref 36.0–46.0)
Hemoglobin: 13.9 g/dL (ref 12.0–15.0)
MCH: 27.5 pg (ref 26.0–34.0)
MCHC: 30.6 g/dL (ref 30.0–36.0)
MCV: 89.7 fL (ref 80.0–100.0)
Platelets: 176 10*3/uL (ref 150–400)
RBC: 5.06 MIL/uL (ref 3.87–5.11)
RDW: 15 % (ref 11.5–15.5)
WBC: 13 10*3/uL — ABNORMAL HIGH (ref 4.0–10.5)
nRBC: 0 % (ref 0.0–0.2)

## 2019-06-26 LAB — TROPONIN I (HIGH SENSITIVITY): Troponin I (High Sensitivity): 3 ng/L (ref <18)

## 2019-06-26 NOTE — ED Triage Notes (Signed)
Pt comes from work at [employer] where their in house MD sent her here due to being unable to do chest xray. 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 Pt states there is a big knot in her chest.

## 2019-06-28 ENCOUNTER — Telehealth: Payer: Self-pay | Admitting: Emergency Medicine

## 2019-06-28 NOTE — Telephone Encounter (Addendum)
Called patient due to lwot to inquire about condition and follow up plans. Left message.  She called me back and she will contact her doctor and let them know of the labs and chest xray available in epic.

## 2019-09-09 ENCOUNTER — Emergency Department: Payer: Managed Care, Other (non HMO)

## 2019-09-09 ENCOUNTER — Encounter: Payer: Self-pay | Admitting: Emergency Medicine

## 2019-09-09 ENCOUNTER — Emergency Department
Admission: EM | Admit: 2019-09-09 | Discharge: 2019-09-09 | Disposition: A | Discharge status: Home / Self Care | Payer: Managed Care, Other (non HMO) | Attending: Emergency Medicine | Admitting: Emergency Medicine

## 2019-09-09 ENCOUNTER — Other Ambulatory Visit: Payer: Self-pay

## 2019-09-09 DIAGNOSIS — I509 Heart failure, unspecified: Secondary | ICD-10-CM | POA: Insufficient documentation

## 2019-09-09 DIAGNOSIS — T8149XA Infection following a procedure, other surgical site, initial encounter: Secondary | ICD-10-CM

## 2019-09-09 DIAGNOSIS — T8131XA Disruption of external operation (surgical) wound, not elsewhere classified, initial encounter: Secondary | ICD-10-CM | POA: Insufficient documentation

## 2019-09-09 LAB — CBC WITH DIFFERENTIAL/PLATELET
Abs Immature Granulocytes: 0.03 10*3/uL (ref 0.00–0.07)
Basophils Absolute: 0.1 10*3/uL (ref 0.0–0.1)
Basophils Relative: 1 %
Eosinophils Absolute: 0.4 10*3/uL (ref 0.0–0.5)
Eosinophils Relative: 4 %
HCT: 41.1 % (ref 36.0–46.0)
Hemoglobin: 13.2 g/dL (ref 12.0–15.0)
Immature Granulocytes: 0 %
Lymphocytes Relative: 22 %
Lymphs Abs: 2 10*3/uL (ref 0.7–4.0)
MCH: 26.6 pg (ref 26.0–34.0)
MCHC: 32.1 g/dL (ref 30.0–36.0)
MCV: 82.7 fL (ref 80.0–100.0)
Monocytes Absolute: 0.6 10*3/uL (ref 0.1–1.0)
Monocytes Relative: 7 %
Neutro Abs: 5.9 10*3/uL (ref 1.7–7.7)
Neutrophils Relative %: 66 %
Platelets: 225 10*3/uL (ref 150–400)
RBC: 4.97 MIL/uL (ref 3.87–5.11)
RDW: 14.7 % (ref 11.5–15.5)
WBC: 8.9 10*3/uL (ref 4.0–10.5)
nRBC: 0 % (ref 0.0–0.2)

## 2019-09-09 LAB — BASIC METABOLIC PANEL
Anion gap: 8 (ref 5–15)
BUN: 12 mg/dL (ref 6–20)
CO2: 23 mmol/L (ref 22–32)
Calcium: 9.6 mg/dL (ref 8.9–10.3)
Chloride: 109 mmol/L (ref 98–111)
Creatinine, Ser: 0.65 mg/dL (ref 0.44–1.00)
GFR calc Af Amer: >60 mL/min (ref >60)
GFR calc non Af Amer: >60 mL/min (ref >60)
Glucose, Bld: 97 mg/dL (ref 70–99)
Potassium: 3.7 mmol/L (ref 3.5–5.1)
Sodium: 140 mmol/L (ref 135–145)

## 2019-09-09 MED ORDER — IOHEXOL 300 MG/ML  SOLN
100.0000 mL | Freq: Once | INTRAMUSCULAR | Status: AC | PRN
  Administered 2019-09-09: 100 mL via INTRAVENOUS
  Filled 2019-09-09: qty 100

## 2019-09-09 MED ORDER — MUPIROCIN 2 % EX OINT: TOPICAL_OINTMENT | CUTANEOUS | 0 refills | Status: AC

## 2019-09-09 MED ORDER — POLYETHYLENE GLYCOL 3350 17 G PO PACK: 17.0000 g | PACK | Freq: Every day | ORAL | 0 refills | Status: DC

## 2019-09-09 MED ORDER — CEPHALEXIN 250 MG PO CAPS: 250.0000 mg | ORAL_CAPSULE | Freq: Four times a day (QID) | ORAL | 0 refills | Status: AC

## 2019-09-09 NOTE — ED Provider Notes (Signed)
ER Provider Note       Time seen: 2:45 PM    I have reviewed the vital signs and the nursing notes.  HISTORY   Chief Complaint Post-op Problem    HPI Jill Levy is a 44 y.o. female with a history of anxiety, cancer, CHF, seizures who presents today for postoperative complication.  Patient states she had a tummy tuck done about 3 weeks ago in [place].  She has been wearing an abdominal binder but her dog jumped on her stomach 4 days ago.  She reports the lower abdominal incision appears to be draining and has a foul smell.  Past Medical History:  Diagnosis Date   Anxiety    Cancer ([place]) cervical   CHF (congestive heart failure) (HCC)    Seizure ([place])     Past Surgical History:  Procedure Laterality Date   TUBAL LIGATION      Allergies Amoxapine and related, Coumadin [warfarin sodium], Lovenox [enoxaparin], and Penicillins  Review of Systems Constitutional: Negative for fever. Cardiovascular: Negative for chest pain. Respiratory: Negative for shortness of breath. Gastrointestinal: Negative for abdominal pain, vomiting and diarrhea. Musculoskeletal: Negative for back pain. Skin: Positive for wound drainage Neurological: Negative for headaches, focal weakness or numbness.  All systems negative/normal/unremarkable except as stated in the HPI  ____________________________________________   PHYSICAL EXAM:  VITAL SIGNS: Vitals:   09/09/19 1256  BP: 135/82  Pulse: 68  Resp: 16  Temp: 99.3 F (37.4 C)  SpO2: 96%    Constitutional: Alert and oriented. Well appearing and in no distress. Eyes: Conjunctivae are normal. Normal extraocular movements. ENT      Head: Normocephalic and atraumatic.      Nose: No congestion/rhinnorhea.      Mouth/Throat: Mucous membranes are moist.      Neck: No stridor. Cardiovascular: Normal rate, regular rhythm. No murmurs, rubs, or gallops. Respiratory: Normal respiratory effort without tachypnea nor retractions.  Breath sounds are clear and equal bilaterally. No wheezes/rales/rhonchi. Gastrointestinal: Low transverse abdominal incision with midline dehiscence of approximately 2 to 3 cm.  Wound does have some smell and erythema although no purulent drainage.  Steri-Strips are still in place over the wound. Musculoskeletal: Nontender with normal range of motion in extremities. No lower extremity tenderness nor edema. Neurologic:  Normal speech and language. No gross focal neurologic deficits are appreciated.  Skin: Small wound dehiscence as noted above. Psychiatric: Speech and behavior are normal.  ____________________________________________   LABS (pertinent positives/negatives)  Labs Reviewed  CBC WITH DIFFERENTIAL/PLATELET  BASIC METABOLIC PANEL    RADIOLOGY  Images were viewed by me CT of the abdomen pelvis with contrast IMPRESSION: 1. Postoperative changes in the subcutaneous fat of bilateral flanks and anterior central abdomen. 2. The LEFT rectus muscle is expanded, suspicious for hematoma and/or edema. No fluid collection identified. 3. Status post hysterectomy. 4. Moderate stool burden and fecal like material within the distal small bowel loops, consistent with stasis. 5. Colonic diverticulosis without acute diverticulitis. 6. Normal appendix.  DIFFERENTIAL DIAGNOSIS  Postoperative infection, cellulitis, abscess  ASSESSMENT AND PLAN  Wound dehiscence, postoperative complication   Plan: The patient had presented for concerns about wound drainage and pain. Patient's labs are reassuring.  Overall the wound looks good, there is a small amount of dehiscence but otherwise it appears to be intact.  I will place her on topical antibiotic ointment and mild oral antibiotics with MiraLAX.  She is cleared for outpatient follow-up with her doctor.  Lenise Arena MD    Note:  This note was generated in part or whole with voice recognition software. Voice recognition is usually quite  accurate but there are transcription errors that can and very often do occur. I apologize for any typographical errors that were not detected and corrected.     Earleen Newport, MD 09/09/19 [phone]

## 2019-09-09 NOTE — ED Notes (Signed)
This RN unsuccessful at IV access for scan. IV team consult placed. MD made aware.

## 2019-09-09 NOTE — ED Triage Notes (Addendum)
Pt here after having tummy tuck.  Dog jumped on stomach 4 days ago.  Reports lower mid abdomen is draining a bloody drainage with foul odor. No fever.   Drainage appears somewhat purulent.

## 2019-09-09 NOTE — ED Notes (Signed)
Called in the waiting room with no answer. °

## 2020-07-22 ENCOUNTER — Other Ambulatory Visit: Payer: Self-pay

## 2020-07-22 ENCOUNTER — Emergency Department
Admission: EM | Admit: 2020-07-22 | Discharge: 2020-07-22 | Disposition: A | Discharge status: Home / Self Care | Payer: Managed Care, Other (non HMO) | Attending: Emergency Medicine | Admitting: Emergency Medicine

## 2020-07-22 ENCOUNTER — Emergency Department: Payer: Managed Care, Other (non HMO)

## 2020-07-22 DIAGNOSIS — R079 Chest pain, unspecified: Secondary | ICD-10-CM | POA: Insufficient documentation

## 2020-07-22 DIAGNOSIS — I509 Heart failure, unspecified: Secondary | ICD-10-CM | POA: Insufficient documentation

## 2020-07-22 DIAGNOSIS — Z8541 Personal history of malignant neoplasm of cervix uteri: Secondary | ICD-10-CM | POA: Diagnosis not present

## 2020-07-22 DIAGNOSIS — R0789 Other chest pain: Secondary | ICD-10-CM

## 2020-07-22 LAB — CBC
HCT: 39.9 % (ref 36.0–46.0)
Hemoglobin: 12.8 g/dL (ref 12.0–15.0)
MCH: 26.9 pg (ref 26.0–34.0)
MCHC: 32.1 g/dL (ref 30.0–36.0)
MCV: 83.8 fL (ref 80.0–100.0)
Platelets: 212 10*3/uL (ref 150–400)
RBC: 4.76 MIL/uL (ref 3.87–5.11)
RDW: 14.8 % (ref 11.5–15.5)
WBC: 13.7 10*3/uL — ABNORMAL HIGH (ref 4.0–10.5)
nRBC: 0 % (ref 0.0–0.2)

## 2020-07-22 LAB — BASIC METABOLIC PANEL
Anion gap: 9 (ref 5–15)
BUN: 17 mg/dL (ref 6–20)
CO2: 23 mmol/L (ref 22–32)
Calcium: 9.7 mg/dL (ref 8.9–10.3)
Chloride: 104 mmol/L (ref 98–111)
Creatinine, Ser: 0.89 mg/dL (ref 0.44–1.00)
GFR, Estimated: >60 mL/min (ref >60)
Glucose, Bld: 90 mg/dL (ref 70–99)
Potassium: 4.1 mmol/L (ref 3.5–5.1)
Sodium: 136 mmol/L (ref 135–145)

## 2020-07-22 LAB — TROPONIN I (HIGH SENSITIVITY): Troponin I (High Sensitivity): <2 ng/L (ref <18)

## 2020-07-22 LAB — D-DIMER, QUANTITATIVE: D-Dimer, Quant: 0.36 ug/mL-FEU (ref 0.00–0.50)

## 2020-07-22 NOTE — ED Notes (Signed)
Pt verbalized understanding of d/c instructions at this time. Follow-up care discussed at this time.  Pt given opportunity to ask questions as needed. Pt ambulatory to ED lobby, NAD noted, RR even and unlabored at this time.

## 2020-07-22 NOTE — ED Triage Notes (Signed)
Pt comes with c/o right sided CP that started this am. Pt states some SOB and dizziness. Pt states BP was elevated earlier today.

## 2020-07-22 NOTE — Discharge Instructions (Addendum)
Please follow up with primary care.  Return to the ER for symptoms that change, worsen, or for new concerns if unable to schedule an appointment.

## 2020-07-22 NOTE — ED Provider Notes (Signed)
  Physical Exam  BP 134/83   Pulse 95   Temp 98.4 F (36.9 C) (Oral)   Resp 18   LMP 08/18/2017   SpO2 100%   Physical Exam  ED Course/Procedures   Clinical Course as of 07/22/20 2306  Wed Jul 22, 2020  1917 DG Chest 2 View [KP]    Clinical Course User Index [KP] Harvest Dark, MD    Procedures  MDM   In short patient is a 45 year old female presenting to the emergency department for evaluation of right-sided chest pain.  Plan is to await results of D-dimer and if positive proceed with CTA/VQ scan and if negative discharged home with follow-up instructions.  D-dimer is not elevated.  Discussed results with patient.  She feels comfortable going home.  She was encouraged to follow-up with her primary care provider.      Jill Dike, FNP 07/22/20 2306    Harvest Dark, MD 07/24/20 [phone]

## 2020-07-22 NOTE — ED Provider Notes (Signed)
[facility] Emergency Department Provider Note  Time seen: 7:34 PM  I have reviewed the triage vital signs and the nursing notes.   HISTORY  Chief Complaint Chest Pain   HPI Jill Levy is a 45 y.o. female with a past medical history of anxiety, cervical cancer, CHF, presents the emergency department for chest pain.  According to the patient  since this morning she has been experiencing a pain which she describes as more of a heavy sensation with occasional sharp pain to the center to the right chest.  Also states her blood pressure is elevated regular today.  Patient denies any shortness of breath diaphoresis or nausea.  Patient states the pain is no worse with deep inspiration.  No cough or fever.  Patient does states she has a history of a "traveling blood clot" in the past but is not sure where the clot was located.  Patient states a history of CHF in the past during pregnancy but has not had issues since per patient.  Past Medical History:  Diagnosis Date  . Anxiety   . Cancer (HCC) cervical  . CHF (congestive heart failure) ([place])   . Seizure ([place])     There are no problems to display for this patient.   Past Surgical History:  Procedure Laterality Date  . TUBAL LIGATION    . tummy tuck      Prior to Admission medications   Medication Sig Start Date End Date Taking? Authorizing Provider  ALPRAZolam Duanne Moron) 0.5 MG tablet Take 0.5 mg by mouth 3 (three) times daily as needed for anxiety.    [provider]  carbamazepine (TEGRETOL) 200 MG tablet Take 200 mg by mouth 2 (two) times daily.    [provider]  CITALOPRAM HYDROBROMIDE PO Take by mouth.    [provider]  clindamycin (CLEOCIN) 150 MG capsule Take 1 capsule (150 mg total) by mouth 4 (four) times daily. 12/19/16   Sable Feil, PA-C  cyclobenzaprine (FLEXERIL) 10 MG tablet Take 1 tablet (10 mg total) by mouth 3 (three) times daily as needed. 12/19/16   Sable Feil, PA-C  cyclobenzaprine (FLEXERIL) 5 MG tablet Take 1 tablet (5 mg total) by mouth 3 (three) times daily as needed for muscle spasms. Patient not taking: Reported on 08/23/2016 07/26/16   Little, Traci M, PA-C  elvitegravir-cobicistat-emtricitabine-tenofovir (GENVOYA) 150-150-200-10 MG TABS tablet Take 1 tablet by mouth daily with breakfast. 05/12/18   Gregor Hams, MD  famotidine (PEPCID) 20 MG tablet Take 1 tablet (20 mg total) by mouth 2 (two) times daily. 03/13/19   Carrie Mew, MD  fexofenadine-pseudoephedrine (ALLEGRA-D) 60-120 MG 12 hr tablet Take 1 tablet by mouth 2 (two) times daily. 12/19/16   Sable Feil, PA-C  ibuprofen (ADVIL,MOTRIN) 600 MG tablet Take 1 tablet (600 mg total) by mouth every 8 (eight) hours as needed. 12/19/16   Sable Feil, PA-C  levETIRAcetam (KEPPRA) 750 MG tablet Take 1 tablet (750 mg total) by mouth 2 (two) times daily. 09/09/18   Hinda Kehr, MD  meclizine (ANTIVERT) 25 MG tablet Take 1 tablet (25 mg total) by mouth 3 (three) times daily as needed for dizziness. 04/03/18   Lavonia Drafts, MD  mupirocin ointment Drue Stager) 2 % Apply to affected area 3 times daily 09/09/19 09/08/20  Earleen Newport, MD  naproxen (NAPROSYN) 500 MG tablet Take 1 tablet (500 mg total) by mouth 2 (two) times daily with a meal. 03/13/19   Carrie Mew,  MD  ondansetron (ZOFRAN ODT) 4 MG disintegrating tablet Take 1 tablet (4 mg total) by mouth every 8 (eight) hours as needed for nausea or vomiting. 03/13/19   Carrie Mew, MD  polyethylene glycol (MIRALAX / GLYCOLAX) 17 g packet Take 17 g by mouth daily. 09/09/19   Earleen Newport, MD  traMADol (ULTRAM) 50 MG tablet Take 1 tablet (50 mg total) by mouth every 12 (twelve) hours as needed. 12/19/16   Sable Feil, PA-C    Allergies  Allergen Reactions  . Amoxapine And Related Hives  . Coumadin [Warfarin Sodium]   . Lovenox [Enoxaparin] Hives  . Penicillins Hives    Has patient had a PCN reaction causing  immediate rash, facial/tongue/throat swelling, SOB or lightheadedness with hypotension: Yes Has patient had a PCN reaction causing severe rash involving mucus membranes or skin necrosis: No Has patient had a PCN reaction that required hospitalization No Has patient had a PCN reaction occurring within the last 10 years: No If all of the above answers are "NO", then may proceed with Cephalosporin use.    No family history on file.  Social History Social History   Tobacco Use  . Smoking status: Never Smoker  . Smokeless tobacco: Never Used  Substance Use Topics  . Alcohol use: No  . Drug use: No    Review of Systems Constitutional: Negative for fever. Cardiovascular: Central to right-sided chest pain since this morning minimal currently. Respiratory: Negative for shortness of breath. Gastrointestinal: Negative for abdominal pain, vomiting  Musculoskeletal: Negative for musculoskeletal complaints Neurological: Negative for headache All other ROS negative  ____________________________________________   PHYSICAL EXAM:  VITAL SIGNS: ED Triage Vitals  Enc Vitals Group     BP 07/22/20 1835 134/83     Pulse Rate 07/22/20 1835 95     Resp 07/22/20 1835 18     Temp 07/22/20 1835 98.4 F (36.9 C)     Temp Source 07/22/20 1835 Oral     SpO2 07/22/20 1835 100 %     Weight --      Height --      Head Circumference --      Peak Flow --      Pain Score 07/22/20 1830 9     Pain Loc --      Pain Edu? --      Excl. in [place]? --     Constitutional: Alert and oriented. Well appearing and in no distress. Eyes: Normal exam ENT      Head: Normocephalic and atraumatic.      Mouth/Throat: Mucous membranes are moist. Cardiovascular: Normal rate, regular rhythm. Respiratory: Normal respiratory effort without tachypnea nor retractions. Breath sounds are clear.  Chest pain is not reproducible on exam. Gastrointestinal: Soft and nontender. No distention.   Musculoskeletal: Nontender with  normal range of motion in all extremities. No lower extremity tenderness or edema. Neurologic:  Normal speech and language. No gross focal neurologic deficits Skin:  Skin is warm, dry and intact.  Psychiatric: Mood and affect are normal.   ____________________________________________    EKG  EKG viewed and interpreted by myself shows a normal sinus rhythm at 98 bpm with a narrow QRS, normal axis, normal intervals, no concerning ST changes.  ____________________________________________    RADIOLOGY  Chest x-ray is negative  ____________________________________________   INITIAL IMPRESSION / ASSESSMENT AND PLAN / ED COURSE  Pertinent labs & imaging results that were available during my care of the patient were reviewed by me and considered in  my medical decision making (see chart for details).   Patient presents emergency department for chest pain since morning to the central to right chest.  More of a heaviness sensation per patient.  We will check labs including cardiac enzymes, chest x-ray and continue to closely monitor.  Given the patient's history of a blood clot and acute onset of pain this morning we will also obtain a D-dimer to help rule out pulmonary embolism.  Patient agreeable to plan of care.  Patient's labs are largely reassuring slight leukocytosis negative troponin.  D-dimer is pending.  Patient's care signed out to oncoming provider.  If D-dimer negative anticipate patient could be discharged home with PCP follow-up if positive patient will require a CTA PE study.  Jill Levy was evaluated in Emergency Department on 07/22/2020 for the symptoms described in the history of present illness. She was evaluated in the context of the global COVID-19 pandemic, which necessitated consideration that the patient might be at risk for infection with the SARS-CoV-2 virus that causes COVID-19. Institutional protocols and algorithms that pertain to the evaluation of patients at  risk for COVID-19 are in a state of rapid change based on information released by regulatory bodies including the CDC and federal and state organizations. These policies and algorithms were followed during the patient's care in the ED.  ____________________________________________   FINAL CLINICAL IMPRESSION(S) / ED DIAGNOSES  Chest pain   Harvest Dark, MD 07/22/20 [phone]

## 2021-01-11 ENCOUNTER — Ambulatory Visit: Payer: Managed Care, Other (non HMO) | Admitting: Urology

## 2021-01-11 ENCOUNTER — Encounter: Payer: Self-pay | Admitting: Urology

## 2021-01-12 ENCOUNTER — Encounter: Payer: Self-pay | Admitting: Urology

## 2021-09-29 IMAGING — CR DG CHEST 2V
1 series · 2 of 2 positions shown · non-contrast
Comparison: June 26, 2019

CLINICAL DATA: Chest pain.

EXAM:
CHEST - 2 VIEW

[Series 1: dg chest 2 view · 0.14mm/px · 2 of 2 slices shown]
[im 1/2]
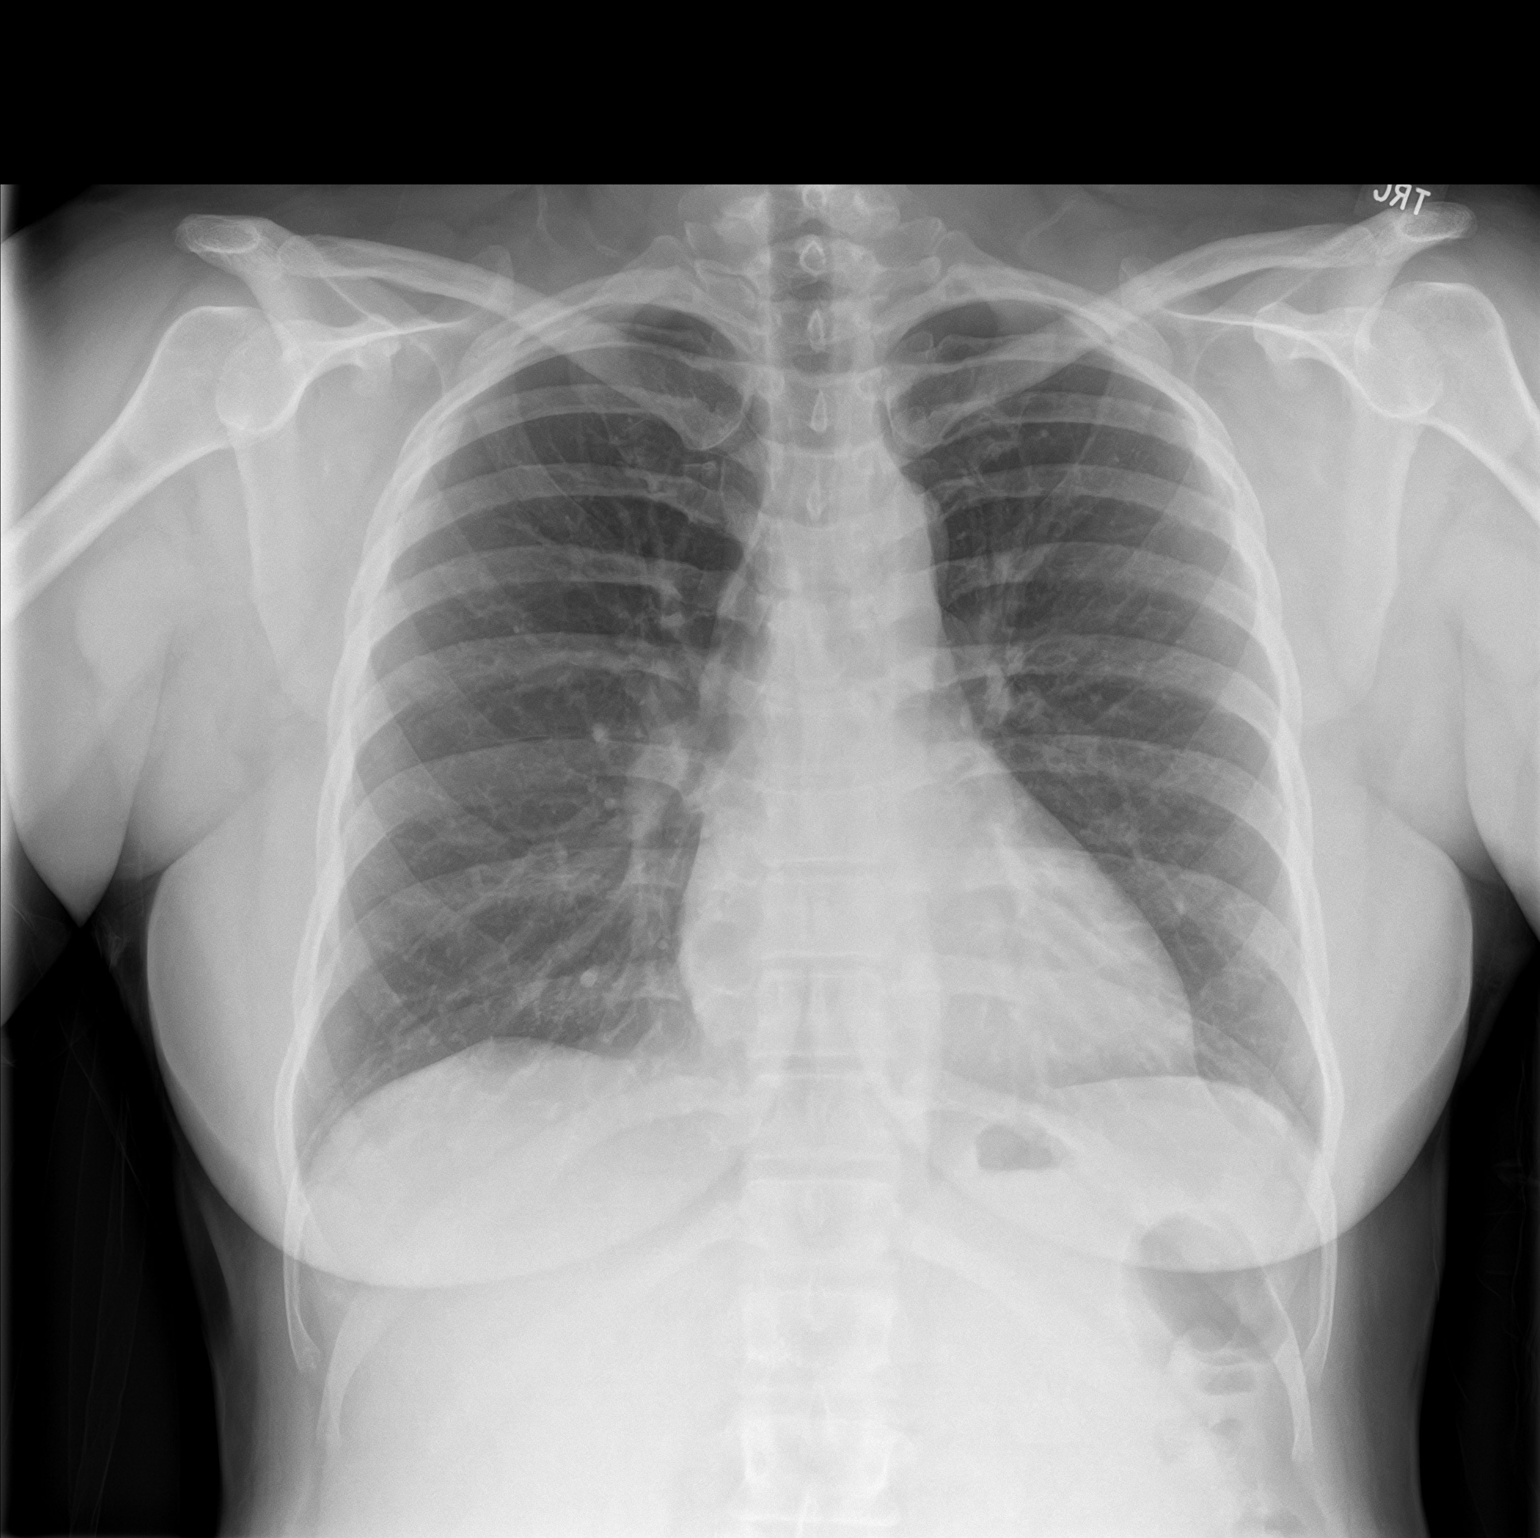
[im 2/2]
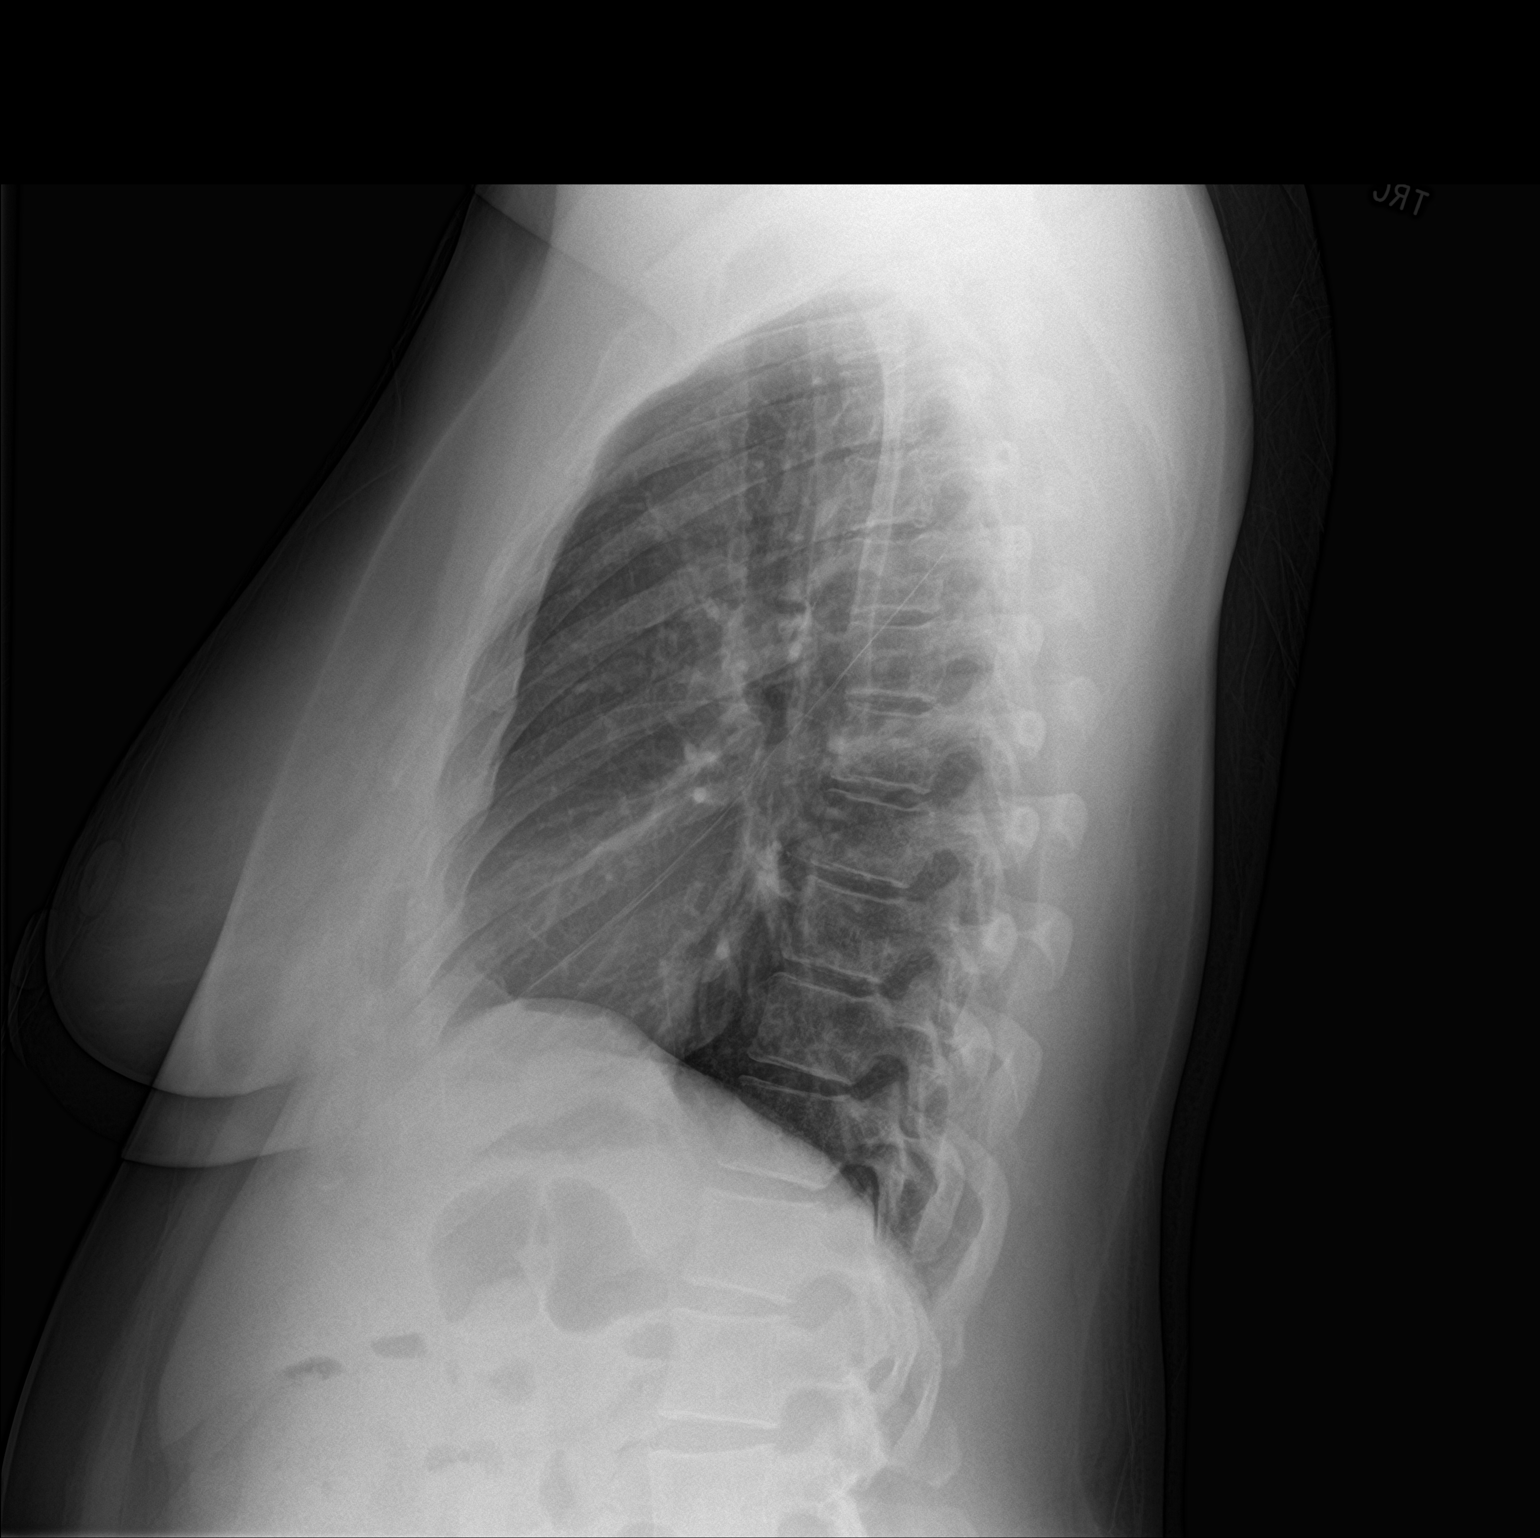

[2 of 2 positions shown; findings below may reference images not displayed]

FINDINGS: The heart size and mediastinal contours are within normal limits.
Both lungs are clear. The visualized skeletal structures are
unremarkable.
IMPRESSION: No active cardiopulmonary disease.

## 2022-01-07 ENCOUNTER — Other Ambulatory Visit: Payer: Self-pay

## 2022-01-07 ENCOUNTER — Emergency Department: Payer: Managed Care, Other (non HMO)

## 2022-01-07 DIAGNOSIS — R42 Dizziness and giddiness: Secondary | ICD-10-CM | POA: Diagnosis present

## 2022-01-07 LAB — CBC
HCT: 42 % (ref 36.0–46.0)
Hemoglobin: 13.5 g/dL (ref 12.0–15.0)
MCH: 26.9 pg (ref 26.0–34.0)
MCHC: 32.1 g/dL (ref 30.0–36.0)
MCV: 83.7 fL (ref 80.0–100.0)
Platelets: 246 10*3/uL (ref 150–400)
RBC: 5.02 MIL/uL (ref 3.87–5.11)
RDW: 14.4 % (ref 11.5–15.5)
WBC: 12.2 10*3/uL — ABNORMAL HIGH (ref 4.0–10.5)
nRBC: 0 % (ref 0.0–0.2)

## 2022-01-07 LAB — BASIC METABOLIC PANEL
Anion gap: 6 (ref 5–15)
BUN: 11 mg/dL (ref 6–20)
CO2: 26 mmol/L (ref 22–32)
Calcium: 10.2 mg/dL (ref 8.9–10.3)
Chloride: 107 mmol/L (ref 98–111)
Creatinine, Ser: 0.7 mg/dL (ref 0.44–1.00)
GFR, Estimated: >60 mL/min (ref >60)
Glucose, Bld: 96 mg/dL (ref 70–99)
Potassium: 3.8 mmol/L (ref 3.5–5.1)
Sodium: 139 mmol/L (ref 135–145)

## 2022-01-07 LAB — URINALYSIS, ROUTINE W REFLEX MICROSCOPIC
Bilirubin Urine: NEGATIVE
Glucose, UA: NEGATIVE mg/dL
Ketones, ur: NEGATIVE mg/dL
Leukocytes,Ua: NEGATIVE
Nitrite: NEGATIVE
Protein, ur: NEGATIVE mg/dL
Specific Gravity, Urine: 1.017 (ref 1.005–1.030)
pH: 6 (ref 5.0–8.0)

## 2022-01-07 LAB — TROPONIN I (HIGH SENSITIVITY)
Troponin I (High Sensitivity): 3 ng/L (ref <18)
Troponin I (High Sensitivity): 3 ng/L (ref <18)

## 2022-01-07 LAB — POC URINE PREG, ED: Preg Test, Ur: NEGATIVE

## 2022-01-07 MED ORDER — MECLIZINE HCL 25 MG PO TABS
25.0000 mg | ORAL_TABLET | Freq: Once | ORAL | Status: AC
  Administered 2022-01-07: 25 mg via ORAL
  Filled 2022-01-07: qty 1

## 2022-01-07 NOTE — ED Provider Triage Note (Signed)
Emergency Medicine Provider Triage Evaluation Note  Jill Levy , a 46 y.o. female  was evaluated in triage.  Pt complains of dizziness..  Review of Systems  Positive: Negative:   Physical Exam  BP 134/84   Pulse 91   Temp 98.3 F (36.8 C) (Oral)   Resp 20   Wt 86.6 kg   LMP 08/18/2017   SpO2 99%   BMI 32.79 kg/m  Gen:   Awake, no distress   Resp:  Normal effort  MSK:   Moves extremities without difficulty  Other:    Medical Decision Making  Medically screening exam initiated at 7:16 PM.  Appropriate orders placed.  Jill Levy was informed that the remainder of the evaluation will be completed by another provider, this initial triage assessment does not replace that evaluation, and the importance of remaining in the ED until their evaluation is complete.     Jill Starks, PA-C 01/07/22 [phone]

## 2022-01-07 NOTE — ED Triage Notes (Signed)
Pt arrives with c/o dizziness and weakness that started this afternoon. Pt endorses blurry vision and nausea. Pt denies headache.

## 2022-01-08 ENCOUNTER — Emergency Department
Admission: EM | Admit: 2022-01-08 | Discharge: 2022-01-08 | Disposition: A | Discharge status: Home / Self Care | Payer: Managed Care, Other (non HMO) | Attending: Emergency Medicine | Admitting: Emergency Medicine

## 2022-01-08 DIAGNOSIS — R42 Dizziness and giddiness: Secondary | ICD-10-CM

## 2022-01-08 MED ORDER — MECLIZINE HCL 25 MG PO TABS: 25.0000 mg | ORAL_TABLET | Freq: Three times a day (TID) | ORAL | 0 refills | Status: DC | PRN

## 2022-01-08 NOTE — ED Provider Notes (Signed)
[facility] Provider Note    Event Date/Time   First MD Initiated Contact with Patient 01/08/22 0004     (approximate)   History   Dizziness   HPI  Jill Levy is a 46 y.o. female  who presents to the emergency department today because of concern for dizziness. Patient's symptoms started around noon today. The patient states that she noticed the feeling of dizziness which was accompanied by staggered walking, nausea. The patient denies any headache. Denies any head trauma. Had similar symptoms 4 days ago although it was short lived. Patient was given meclizine in triage and states that her symptoms started resolving shortly afterwards. At the time of my exam the patient feels back to her baseline.     Physical Exam   Triage Vital Signs: ED Triage Vitals [01/07/22 1911]  Enc Vitals Group     BP 134/84     Pulse Rate 91     Resp 20     Temp 98.3 F (36.8 C)     Temp Source Oral     SpO2 99 %     Weight 191 lb (86.6 kg)     Height      Head Circumference      Peak Flow      Pain Score 0     Pain Loc      Pain Edu?      Excl. in [place]?     Most recent vital signs: Vitals:   01/07/22 1911 01/07/22 2247  BP: 134/84 128/74  Pulse: 91 81  Resp: 20 18  Temp: 98.3 F (36.8 C)   SpO2: 99% 98%   General: Awake, alert, oriented. CV:  Good peripheral perfusion. Regular rate and rhythm. Resp:  Normal effort. Lungs clear. Abd:  No distention.    ED Results / Procedures / Treatments   Labs (all labs ordered are listed, but only abnormal results are displayed) Labs Reviewed  CBC - Abnormal; Notable for the following components:      Result Value   WBC 12.2 (*)    All other components within normal limits  URINALYSIS, ROUTINE W REFLEX MICROSCOPIC - Abnormal; Notable for the following components:   Color, Urine YELLOW (*)    APPearance CLEAR (*)    Hgb urine dipstick SMALL (*)    Bacteria, UA MANY (*)    All other components within normal  limits  BASIC METABOLIC PANEL  POC URINE PREG, ED  CBG MONITORING, ED  TROPONIN I (HIGH SENSITIVITY)  TROPONIN I (HIGH SENSITIVITY)     EKG  I, Nance Pear, attending physician, personally viewed and interpreted this EKG  EKG Time: 1914 Rate: 78 Rhythm: normal sinus rhythm Axis: left axis deviation Intervals: qtc 389 QRS: narrow, q waves III, aVF ST changes: no st elevation Impression: abnormal ekg    RADIOLOGY I independently interpreted and visualized the CT head. My interpretation: No acute bleed Radiology interpretation:  IMPRESSION:  Negative noncontrast head CT.     PROCEDURES:  Critical Care performed: No  Procedures   MEDICATIONS ORDERED IN ED: Medications  meclizine (ANTIVERT) tablet 25 mg (25 mg Oral Given 01/07/22 1925)     IMPRESSION / MDM / ASSESSMENT AND PLAN / ED COURSE  I reviewed the triage vital signs and the nursing notes.                              Differential diagnosis  includes, but is not limited to, vertigo, intracranial process, anemia, electrolyte abnormality, dehydration.  Patient's presentation is most consistent with acute presentation with potential threat to life or bodily function.  Patient presented to the emergency department today because of concern for dizziness. At the time of my exam she stated she felt back to baseline and that she started to improve after being given meclizine. Blood work without concerning anemia or electrolyte abnormality. CT head without acute abnormality. Given clinical improvement with meclizine and reported symptoms I do feel vertigo likely. At this time do not feel any further emergent work up is necessary. Will give prescription for meclizine and ENT follow up information.   FINAL CLINICAL IMPRESSION(S) / ED DIAGNOSES   Final diagnoses:  Vertigo    Note:  This document was prepared using Dragon voice recognition software and may include unintentional dictation errors.    Nance Pear, MD 01/08/22 [phone]

## 2022-01-08 NOTE — Discharge Instructions (Signed)
Please seek medical attention for any high fevers, chest pain, shortness of breath, change in behavior, persistent vomiting, bloody stool or any other new or concerning symptoms.  

## 2022-06-20 ENCOUNTER — Other Ambulatory Visit: Payer: Self-pay

## 2022-11-28 ENCOUNTER — Emergency Department
Admission: EM | Admit: 2022-11-28 | Discharge: 2022-11-28 | Discharge status: Against Medical Advice | Payer: Managed Care, Other (non HMO) | Attending: Emergency Medicine | Admitting: Emergency Medicine

## 2022-11-28 ENCOUNTER — Other Ambulatory Visit: Payer: Self-pay

## 2022-11-28 DIAGNOSIS — Z5321 Procedure and treatment not carried out due to patient leaving prior to being seen by health care provider: Secondary | ICD-10-CM | POA: Diagnosis not present

## 2022-11-28 DIAGNOSIS — R1110 Vomiting, unspecified: Secondary | ICD-10-CM | POA: Diagnosis not present

## 2022-11-28 DIAGNOSIS — R509 Fever, unspecified: Secondary | ICD-10-CM | POA: Insufficient documentation

## 2022-11-28 DIAGNOSIS — Z1152 Encounter for screening for COVID-19: Secondary | ICD-10-CM | POA: Diagnosis not present

## 2022-11-28 DIAGNOSIS — O99891 Other specified diseases and conditions complicating pregnancy: Secondary | ICD-10-CM | POA: Diagnosis present

## 2022-11-28 DIAGNOSIS — R197 Diarrhea, unspecified: Secondary | ICD-10-CM | POA: Insufficient documentation

## 2022-11-28 LAB — HCG, QUANTITATIVE, PREGNANCY: hCG, Beta Chain, Quant, S: <1 m[IU]/mL (ref <5)

## 2022-11-28 LAB — SARS CORONAVIRUS 2 BY RT PCR: SARS Coronavirus 2 by RT PCR: NEGATIVE

## 2022-11-28 NOTE — ED Triage Notes (Signed)
Pt to ED for diarrhea, emesis, fever started yesterday., [redacted] weeks pregnant. Max temp 99.6.   Pt has hysterectomy listed on 04/27/18, pt states this is false.

## 2023-02-10 ENCOUNTER — Emergency Department: Payer: Managed Care, Other (non HMO)

## 2023-02-10 ENCOUNTER — Other Ambulatory Visit: Payer: Self-pay

## 2023-02-10 ENCOUNTER — Emergency Department
Admission: EM | Admit: 2023-02-10 | Discharge: 2023-02-10 | Disposition: A | Discharge status: Home / Self Care | Payer: Managed Care, Other (non HMO) | Attending: Student in an Organized Health Care Education/Training Program | Admitting: Student in an Organized Health Care Education/Training Program

## 2023-02-10 ENCOUNTER — Encounter: Payer: Self-pay | Admitting: Emergency Medicine

## 2023-02-10 DIAGNOSIS — R569 Unspecified convulsions: Secondary | ICD-10-CM | POA: Diagnosis present

## 2023-02-10 DIAGNOSIS — M791 Myalgia, unspecified site: Secondary | ICD-10-CM | POA: Diagnosis not present

## 2023-02-10 DIAGNOSIS — R079 Chest pain, unspecified: Secondary | ICD-10-CM | POA: Diagnosis not present

## 2023-02-10 DIAGNOSIS — G40909 Epilepsy, unspecified, not intractable, without status epilepticus: Secondary | ICD-10-CM | POA: Insufficient documentation

## 2023-02-10 LAB — BASIC METABOLIC PANEL
Anion gap: 10 (ref 5–15)
BUN: 10 mg/dL (ref 6–20)
CO2: 22 mmol/L (ref 22–32)
Calcium: 9.5 mg/dL (ref 8.9–10.3)
Chloride: 106 mmol/L (ref 98–111)
Creatinine, Ser: 0.86 mg/dL (ref 0.44–1.00)
GFR, Estimated: >60 mL/min (ref >60)
Glucose, Bld: 91 mg/dL (ref 70–99)
Potassium: 3.7 mmol/L (ref 3.5–5.1)
Sodium: 138 mmol/L (ref 135–145)

## 2023-02-10 LAB — CBC WITH DIFFERENTIAL/PLATELET
Abs Immature Granulocytes: 0.02 10*3/uL (ref 0.00–0.07)
Basophils Absolute: 0.1 10*3/uL (ref 0.0–0.1)
Basophils Relative: 1 %
Eosinophils Absolute: 0.1 10*3/uL (ref 0.0–0.5)
Eosinophils Relative: 1 %
HCT: 43 % (ref 36.0–46.0)
Hemoglobin: 13.8 g/dL (ref 12.0–15.0)
Immature Granulocytes: 0 %
Lymphocytes Relative: 19 %
Lymphs Abs: 1.5 10*3/uL (ref 0.7–4.0)
MCH: 26.8 pg (ref 26.0–34.0)
MCHC: 32.1 g/dL (ref 30.0–36.0)
MCV: 83.5 fL (ref 80.0–100.0)
Monocytes Absolute: 0.4 10*3/uL (ref 0.1–1.0)
Monocytes Relative: 6 %
Neutro Abs: 5.9 10*3/uL (ref 1.7–7.7)
Neutrophils Relative %: 73 %
Platelets: 219 10*3/uL (ref 150–400)
RBC: 5.15 MIL/uL — ABNORMAL HIGH (ref 3.87–5.11)
RDW: 15 % (ref 11.5–15.5)
WBC: 8 10*3/uL (ref 4.0–10.5)
nRBC: 0 % (ref 0.0–0.2)

## 2023-02-10 LAB — TROPONIN I (HIGH SENSITIVITY): Troponin I (High Sensitivity): <2 ng/L (ref <18)

## 2023-02-10 NOTE — ED Provider Triage Note (Signed)
Emergency Medicine Provider Triage Evaluation Note  Jill Levy , a 47 y.o. female  was evaluated in triage.  Pt complains of possible seizure. Got the flu shot today and reports that she always has a "reaction" to it, normally passes out. EMS reports post-ictal after the seizure. Did not urinate on herself. No tongue biting. Reports chest pain currently, reports headache. No vomiting. Patient reports used to take keppra but has been taken off due to no seizures in many years. Did not injure herself during the event. Reports arms and legs feel heavy.  Review of Systems  Positive: Chest pain, headache Negative: Vomiting fever  Physical Exam  LMP 08/18/2017  Gen:   Awake, no distress   Resp:  Normal effort  MSK:   Moves extremities without difficulty  Other:  No visible tongue trauma  Medical Decision Making  Medically screening exam initiated at 9:48 AM.  Appropriate orders placed.  Jill Levy was informed that the remainder of the evaluation will be completed by another provider, this initial triage assessment does not replace that evaluation, and the importance of remaining in the ED until their evaluation is complete.     Jill Hoehn, PA-C 02/10/23 [phone]

## 2023-02-10 NOTE — ED Provider Notes (Signed)
[facility] Provider Note    Event Date/Time   First MD Initiated Contact with Patient 02/10/23 1211     (approximate)   History   Seizures   HPI  Jill Levy is a 47 y.o. female past medical history significant for seizure disorder, who presents to the emergency department after having witnessed seizure-like activity.  States that she has a "allergy to influenza shot".  And has had multiple reactions to it in the past.  States that today she was getting her influenza shot and had witnessed seizure-like activity that occurred twice.  States that she is feeling very tired and fatigued since that episode with bodyaches.  Complaining of some mild chest pain.  Normal state of health this morning.  States that she has not been following with a neurologist for the past 2 years.  Believes that someone took her off of her Keppra.  Denies any fever, chills, dysuria.     Physical Exam   Triage Vital Signs: ED Triage Vitals  Encounter Vitals Group     BP 02/10/23 0950 112/82     Systolic BP Percentile --      Diastolic BP Percentile --      Pulse Rate 02/10/23 0950 80     Resp 02/10/23 0950 18     Temp 02/10/23 0950 98.2 F (36.8 C)     Temp Source 02/10/23 0950 Oral     SpO2 02/10/23 0950 100 %     Weight 02/10/23 0951 191 lb 12.8 oz (87 kg)     Height 02/10/23 0951 5\' 4"  (1.626 m)     Head Circumference --      Peak Flow --      Pain Score 02/10/23 0950 3     Pain Loc --      Pain Education --      Exclude from Growth Chart --     Most recent vital signs: Vitals:   02/10/23 0950  BP: 112/82  Pulse: 80  Resp: 18  Temp: 98.2 F (36.8 C)  SpO2: 100%    Physical Exam Constitutional:      Appearance: She is well-developed.  HENT:     Head: Atraumatic.  Eyes:     Conjunctiva/sclera: Conjunctivae normal.  Cardiovascular:     Rate and Rhythm: Regular rhythm.  Pulmonary:     Effort: No respiratory distress.  Abdominal:     General: There  is no distension.  Musculoskeletal:        General: Normal range of motion.     Cervical back: Normal range of motion.  Skin:    General: Skin is warm.  Neurological:     Mental Status: She is alert. Mental status is at baseline.     GCS: GCS eye subscore is 4. GCS verbal subscore is 5. GCS motor subscore is 6.     Cranial Nerves: Cranial nerves 2-12 are intact.     Sensory: Sensation is intact.     Motor: Motor function is intact.     Gait: Gait is intact.     IMPRESSION / MDM / ASSESSMENT AND PLAN / ED COURSE  I reviewed the triage vital signs and the nursing notes.  Differential diagnosis including seizure, drug reaction, dysrhythmia, ACS, electrolyte abnormality, syncope  EKG  I, Corena Herter, the attending physician, personally viewed and interpreted this ECG.   Rate: Normal  Rhythm: Normal sinus  Axis: Normal  Intervals: Normal  ST&T Change: Isolated T wave  inverted to lead III.  Nonspecific ST changes   RADIOLOGY I independently reviewed imaging, my interpretation of imaging: No signs of intracranial hemorrhage or mass.  Read as no acute findings.  Noted a cyst in the sinuses and I discussed this finding with the patient  LABS (all labs ordered are listed, but only abnormal results are displayed) Labs interpreted as -    Labs Reviewed  CBC WITH DIFFERENTIAL/PLATELET - Abnormal; Notable for the following components:      Result Value   RBC 5.15 (*)    All other components within normal limits  BASIC METABOLIC PANEL  URINALYSIS, ROUTINE W REFLEX MICROSCOPIC  TROPONIN I (HIGH SENSITIVITY)  TROPONIN I (HIGH SENSITIVITY)     MDM    No significant electrolyte abnormality.  Patient is back to baseline.  Clinical picture is not consistent with a meningitis.  Has a nonfocal neurologic exam and no concern for CVA.  On chart review does have a history of seizures has been on Keppra in the past.  No recent neurology visit.  Suspicion for dysrhythmia or ACS, troponin  is undetectable.  Discussed following up with neurology as an outpatient.  Seizure versus syncopal episode secondary to needlestick.  Does state that she has had a similar episode with COVID-vaccine in the past.  Discussed close follow-up with her primary care physician and discussed follow-up with neurology.  No questions at time of discharge.  Given return precautions.   PROCEDURES:  Critical Care performed: No  Procedures  Patient's presentation is most consistent with acute presentation with potential threat to life or bodily function.   MEDICATIONS ORDERED IN ED: Medications - No data to display  FINAL CLINICAL IMPRESSION(S) / ED DIAGNOSES   Final diagnoses:  Seizure-like activity (HCC)     Rx / DC Orders   ED Discharge Orders          Ordered    Ambulatory referral to Neurology       Comments: An appointment is requested in approximately: 8 weeks   02/10/23 1231             Note:  This document was prepared using Dragon voice recognition software and may include unintentional dictation errors.   Corena Herter, MD 02/10/23 1236

## 2023-02-10 NOTE — ED Notes (Signed)
See triage note  States she had 2 sz at work  States she had had sz as a child    Sleepy  but alert

## 2023-02-10 NOTE — ED Triage Notes (Signed)
Patient to ED via [facility] from work for 2 seizures lasting approx 20 secs each. Patient states she received the flu vaccine today and she always has some sort of reaction afterwards. Ems reports weakness in lower extremities since. Aox4 in triage. Hx of seizures as a child- does not take meds for same. Having pain in head and chest- states pressure feeling.

## 2023-02-14 ENCOUNTER — Encounter: Payer: Self-pay | Admitting: Neurology

## 2023-03-30 ENCOUNTER — Ambulatory Visit: Payer: Managed Care, Other (non HMO) | Admitting: Neurology

## 2023-03-30 ENCOUNTER — Encounter: Payer: Self-pay | Admitting: Neurology

## 2023-03-30 DIAGNOSIS — Z029 Encounter for administrative examinations, unspecified: Secondary | ICD-10-CM

## 2023-09-25 ENCOUNTER — Other Ambulatory Visit: Payer: Self-pay | Admitting: Nurse Practitioner

## 2023-09-25 DIAGNOSIS — Z1231 Encounter for screening mammogram for malignant neoplasm of breast: Secondary | ICD-10-CM
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
## 2023-10-03 ENCOUNTER — Encounter: Payer: Self-pay | Admitting: Psychiatry

## 2023-10-03 ENCOUNTER — Other Ambulatory Visit: Payer: Self-pay

## 2023-10-03 ENCOUNTER — Ambulatory Visit (INDEPENDENT_AMBULATORY_CARE_PROVIDER_SITE_OTHER): Admitting: Psychiatry

## 2023-10-03 VITALS — BP 109/79 | HR 89 | Temp 97.0°F | Ht 64.0 in | Wt 196.4 lb

## 2023-10-03 DIAGNOSIS — F419 Anxiety disorder, unspecified: Secondary | ICD-10-CM | POA: Diagnosis not present

## 2023-10-03 DIAGNOSIS — F321 Major depressive disorder, single episode, moderate: Secondary | ICD-10-CM

## 2023-10-03 DIAGNOSIS — R41840 Attention and concentration deficit: Secondary | ICD-10-CM | POA: Diagnosis not present

## 2023-10-03 MED ORDER — VENLAFAXINE HCL ER 75 MG PO CP24: 75.0000 mg | ORAL_CAPSULE | Freq: Every day | ORAL | 1 refills | Status: DC

## 2023-10-03 MED ORDER — VENLAFAXINE HCL ER 37.5 MG PO CP24: 37.5000 mg | ORAL_CAPSULE | Freq: Every day | ORAL | 0 refills | Status: DC

## 2023-10-03 MED ORDER — FLUOXETINE HCL 20 MG PO CAPS: 20.0000 mg | ORAL_CAPSULE | Freq: Every day | ORAL | 0 refills | Status: DC

## 2023-10-03 NOTE — Patient Instructions (Signed)
 Decrease fluoxetine  20 mg daily for one week, then discontinue Start venlafaxine  37.5 mg daily for one week, then 75 mg daily Obtain record from your neurologist  Next appointment- 9/11 at 10:30

## 2023-10-10 ENCOUNTER — Emergency Department
Admission: EM | Admit: 2023-10-10 | Discharge: 2023-10-10 | Disposition: A | Discharge status: Home / Self Care | Attending: Emergency Medicine | Admitting: Emergency Medicine

## 2023-10-10 ENCOUNTER — Other Ambulatory Visit: Payer: Self-pay

## 2023-10-10 ENCOUNTER — Emergency Department

## 2023-10-10 DIAGNOSIS — R0789 Other chest pain: Secondary | ICD-10-CM | POA: Insufficient documentation

## 2023-10-10 DIAGNOSIS — R079 Chest pain, unspecified: Secondary | ICD-10-CM | POA: Diagnosis present

## 2023-10-10 LAB — CBC
HCT: 41.1 % (ref 36.0–46.0)
Hemoglobin: 12.9 g/dL (ref 12.0–15.0)
MCH: 26.9 pg (ref 26.0–34.0)
MCHC: 31.4 g/dL (ref 30.0–36.0)
MCV: 85.6 fL (ref 80.0–100.0)
Platelets: 231 K/uL (ref 150–400)
RBC: 4.8 MIL/uL (ref 3.87–5.11)
RDW: 14.9 % (ref 11.5–15.5)
WBC: 10.1 K/uL (ref 4.0–10.5)
nRBC: 0 % (ref 0.0–0.2)

## 2023-10-10 LAB — BASIC METABOLIC PANEL WITH GFR
Anion gap: 6 (ref 5–15)
BUN: 11 mg/dL (ref 6–20)
CO2: 25 mmol/L (ref 22–32)
Calcium: 9.4 mg/dL (ref 8.9–10.3)
Chloride: 105 mmol/L (ref 98–111)
Creatinine, Ser: 0.79 mg/dL (ref 0.44–1.00)
GFR, Estimated: >60 mL/min (ref >60)
Glucose, Bld: 92 mg/dL (ref 70–99)
Potassium: 3.6 mmol/L (ref 3.5–5.1)
Sodium: 136 mmol/L (ref 135–145)

## 2023-10-10 LAB — TROPONIN I (HIGH SENSITIVITY): Troponin I (High Sensitivity): 3 ng/L (ref <18)

## 2023-10-10 MED ORDER — LIDOCAINE 5 % EX PTCH
2.0000 | MEDICATED_PATCH | CUTANEOUS | Status: DC
  Administered 2023-10-10: 2 via TRANSDERMAL
  Filled 2023-10-10: qty 2

## 2023-10-10 MED ORDER — KETOROLAC TROMETHAMINE 30 MG/ML IJ SOLN
30.0000 mg | Freq: Once | INTRAMUSCULAR | Status: AC
  Administered 2023-10-10: 30 mg via INTRAMUSCULAR
  Filled 2023-10-10: qty 1

## 2023-10-10 MED ORDER — LIDOCAINE 5 % EX PTCH
1.0000 | MEDICATED_PATCH | Freq: Two times a day (BID) | CUTANEOUS | 1 refills | Status: DC
Start: 2023-10-10 — End: 2024-01-04

## 2023-10-10 MED ORDER — METHOCARBAMOL 500 MG PO TABS: 500.0000 mg | ORAL_TABLET | Freq: Three times a day (TID) | ORAL | 0 refills | Status: DC | PRN

## 2023-10-10 NOTE — ED Provider Notes (Signed)
 [facility] Provider Note    Event Date/Time   First MD Initiated Contact with Patient 10/10/23 1318     (approximate)   History   Chest Pain   HPI  Jill Levy is a 48 y.o. female who presents to the ED for evaluation of Chest Pain   I review a psychiatry clinic visit from last week.  History of anxiety, PTSD and seizure disorder.  No cardiac history.  Patient presents alongside her daughter for evaluation of 1-2 days of atraumatic right shoulder, right chest, right arm pain with paresthesias to her right hand.   Physical Exam   Triage Vital Signs: ED Triage Vitals  Encounter Vitals Group     BP 10/10/23 1106 (!) 125/102     Girls Systolic BP Percentile --      Girls Diastolic BP Percentile --      Boys Systolic BP Percentile --      Boys Diastolic BP Percentile --      Pulse Rate 10/10/23 1106 83     Resp 10/10/23 1106 18     Temp 10/10/23 1106 98.3 F (36.8 C)     Temp Source 10/10/23 1106 Oral     SpO2 10/10/23 1106 100 %     Weight 10/10/23 1114 195 lb (88.5 kg)     Height 10/10/23 1114 5' 4 (1.626 m)     Head Circumference --      Peak Flow --      Pain Score 10/10/23 1114 8     Pain Loc --      Pain Education --      Exclude from Growth Chart --     Most recent vital signs: Vitals:   10/10/23 1106  BP: (!) 125/102  Pulse: 83  Resp: 18  Temp: 98.3 F (36.8 C)  SpO2: 100%    General: Awake, no distress.  CV:  Good peripheral perfusion.  Resp:  Normal effort.  Abd:  No distention.  MSK:  No deformity noted.  Tenderness to palpation of right trapezius musculature, superior right sided anterior chest wall that reproduces her symptoms without overlying skin changes or signs of trauma. Neuro:  No focal deficits appreciated. Other:     ED Results / Procedures / Treatments   Labs (all labs ordered are listed, but only abnormal results are displayed) Labs Reviewed  BASIC METABOLIC PANEL WITH GFR  CBC  POC URINE  PREG, ED  TROPONIN I (HIGH SENSITIVITY)  TROPONIN I (HIGH SENSITIVITY)    EKG Sinus rhythm with a rate of 83 bpm.  Normal axis and intervals without clear signs of acute ischemia.  RADIOLOGY CXR interpreted by me without evidence of acute cardiopulmonary pathology.  Official radiology report(s): DG Chest 2 View Result Date: 10/10/2023 CLINICAL DATA:  Chest pain EXAM: CHEST - 2 VIEW COMPARISON:  None Available. FINDINGS: The heart size and mediastinal contours are within normal limits. Both lungs are clear. The visualized skeletal structures are unremarkable. IMPRESSION: No active cardiopulmonary disease. Electronically Signed   By: Dorethia Molt M.D.   On: 10/10/2023 12:34    PROCEDURES and INTERVENTIONS:  .1-3 Lead EKG Interpretation  Performed by: Claudene Rover, MD Authorized by: Claudene Rover, MD     Interpretation: normal     ECG rate:  80   ECG rate assessment: normal     Rhythm: sinus rhythm     Ectopy: none     Conduction: normal     Medications  lidocaine  (  LIDODERM ) 5 % 2 patch (2 patches Transdermal Patch Applied 10/10/23 1446)  ketorolac  (TORADOL ) 30 MG/ML injection 30 mg (30 mg Intramuscular Given 10/10/23 1445)     IMPRESSION / MDM / ASSESSMENT AND PLAN / ED COURSE  I reviewed the triage vital signs and the nursing notes.  Differential diagnosis includes, but is not limited to, ACS, PTX, PNA, muscle strain/spasm, PE, dissection, anxiety, pleural effusion  {Patient presents with symptoms of an acute illness or injury that is potentially life-threatening.  Patient presents with right-sided chest and shoulder discomfort of likely musculoskeletal etiology.  She is fairly low risk with a nonischemic EKG, negative troponin, normal CXR, CBC and metabolic panel.  Will provide nonnarcotic analgesia and reassess.  Suspect should be suitable for outpatient management.  Considering the chronicity of her symptoms I do not believe a second troponin is necessary.  Clinical Course  as of 10/10/23 1534  Tue Oct 10, 2023  1531 reassessed [DS]    Clinical Course User Index [DS] Claudene Rover, MD     FINAL CLINICAL IMPRESSION(S) / ED DIAGNOSES   Final diagnoses:  Other chest pain     Rx / DC Orders   ED Discharge Orders          Ordered    lidocaine  (LIDODERM ) 5 %  Every 12 hours        10/10/23 1533    methocarbamol  (ROBAXIN ) 500 MG tablet  Every 8 hours PRN        10/10/23 1533             Note:  This document was prepared using Dragon voice recognition software and may include unintentional dictation errors.   Claudene Rover, MD 10/10/23 [phone]

## 2023-10-10 NOTE — Discharge Instructions (Addendum)
Please take Tylenol and ibuprofen/Advil for your pain.  It is safe to take them together, or to alternate them every few hours.  Take up to 1000mg  of Tylenol at a time, up to 4 times per day.  Do not take more than 4000 mg of Tylenol in 24 hours.  For ibuprofen, take 400-600 mg, 3 - 4 times per day.  Use Robaxin muscle relaxer as needed for more severe/breakthrough pain, up to 3 times per day. This medication can make some people sleepy, so do not use while driving, working or operating machinery  Please use lidocaine patches at your site of pain.  Apply 1 patch at a time, leave on for 12 hours, then remove for 12 hours.  12 hours on, 12 hours off.  Do not apply more than 1 patch at a time.

## 2023-10-10 NOTE — ED Triage Notes (Signed)
 Pt c/o CP that started last night around 7pm with radiation to her right arm. Pt reports having med hx of depression, no cardiac hx

## 2023-10-10 NOTE — ED Notes (Signed)
 Bloodwork attempted. Pt is a very hard stick. Tech and RN aware.
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
## 2023-11-16 ENCOUNTER — Ambulatory Visit: Admitting: Psychiatry

## 2024-01-04 ENCOUNTER — Ambulatory Visit: Admitting: Psychiatry

## 2024-01-04 ENCOUNTER — Encounter: Payer: Self-pay | Admitting: Psychiatry

## 2024-01-04 VITALS — BP 118/70 | HR 90 | Temp 97.7°F | Ht 64.5 in | Wt 194.0 lb

## 2024-01-04 DIAGNOSIS — F321 Major depressive disorder, single episode, moderate: Secondary | ICD-10-CM

## 2024-01-04 DIAGNOSIS — R41840 Attention and concentration deficit: Secondary | ICD-10-CM

## 2024-01-04 DIAGNOSIS — F419 Anxiety disorder, unspecified: Secondary | ICD-10-CM | POA: Diagnosis not present

## 2024-01-04 MED ORDER — PRAZOSIN HCL 1 MG PO CAPS: 1.0000 mg | ORAL_CAPSULE | Freq: Every day | ORAL | 1 refills | Status: AC

## 2024-01-04 MED ORDER — VENLAFAXINE HCL ER 150 MG PO CP24: 150.0000 mg | ORAL_CAPSULE | Freq: Every day | ORAL | 1 refills | Status: AC

## 2024-01-04 NOTE — Patient Instructions (Signed)
 Restart/increase venlafaxine  150 mg daily  Start prazosin 1 mg at night after one week of starting venlafaxine  Next appointment- 12/16 at 3:30
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
## 2024-01-11 ENCOUNTER — Ambulatory Visit: Admitting: Psychiatry

## 2024-02-17 NOTE — Progress Notes (Unsigned)
 No show

## 2024-02-20 ENCOUNTER — Ambulatory Visit: Admitting: Psychiatry

## 2024-06-13 ENCOUNTER — Ambulatory Visit: Admitting: Psychiatry
